# Patient Record
Sex: Male | Born: 1955 | Race: Black or African American | Hispanic: No | Marital: Single | Smoking: Former smoker
Health system: Southern US, Community
[De-identification: ages and names within clinical notes are randomized; demographics above are authoritative.]

## PROBLEM LIST (undated history)

## (undated) DIAGNOSIS — C61 Malignant neoplasm of prostate: Secondary | ICD-10-CM

## (undated) DIAGNOSIS — I493 Ventricular premature depolarization: Secondary | ICD-10-CM

## (undated) DIAGNOSIS — I5022 Chronic systolic (congestive) heart failure: Secondary | ICD-10-CM

## (undated) DIAGNOSIS — G4730 Sleep apnea, unspecified: Secondary | ICD-10-CM

## (undated) DIAGNOSIS — I517 Cardiomegaly: Secondary | ICD-10-CM

## (undated) DIAGNOSIS — I428 Other cardiomyopathies: Secondary | ICD-10-CM

## (undated) DIAGNOSIS — I2510 Atherosclerotic heart disease of native coronary artery without angina pectoris: Secondary | ICD-10-CM

## (undated) DIAGNOSIS — I639 Cerebral infarction, unspecified: Secondary | ICD-10-CM

## (undated) DIAGNOSIS — I10 Essential (primary) hypertension: Secondary | ICD-10-CM

## (undated) DIAGNOSIS — I472 Ventricular tachycardia: Secondary | ICD-10-CM

## (undated) HISTORY — DX: Sleep apnea, unspecified: G47.30

## (undated) HISTORY — DX: Malignant neoplasm of prostate: C61

## (undated) HISTORY — PX: OTHER SURGICAL HISTORY: SHX169

## (undated) HISTORY — DX: Essential (primary) hypertension: I10

## (undated) HISTORY — DX: Cerebral infarction, unspecified: I63.9

## (undated) HISTORY — DX: Cardiomegaly: I51.7

---

## 2003-08-07 ENCOUNTER — Other Ambulatory Visit: Payer: Self-pay

## 2003-10-24 ENCOUNTER — Other Ambulatory Visit: Payer: Self-pay

## 2005-10-27 ENCOUNTER — Emergency Department: Payer: Self-pay | Admitting: Emergency Medicine

## 2005-12-02 ENCOUNTER — Emergency Department: Payer: Self-pay | Admitting: Emergency Medicine

## 2005-12-02 ENCOUNTER — Other Ambulatory Visit: Payer: Self-pay

## 2005-12-06 ENCOUNTER — Ambulatory Visit: Payer: Self-pay | Admitting: Internal Medicine

## 2006-04-29 ENCOUNTER — Emergency Department: Payer: Self-pay | Admitting: Emergency Medicine

## 2006-05-04 ENCOUNTER — Inpatient Hospital Stay: Payer: Self-pay | Admitting: Internal Medicine

## 2006-05-04 ENCOUNTER — Other Ambulatory Visit: Payer: Self-pay

## 2006-12-21 ENCOUNTER — Inpatient Hospital Stay: Payer: Self-pay | Admitting: Internal Medicine

## 2006-12-21 ENCOUNTER — Other Ambulatory Visit: Payer: Self-pay

## 2007-05-15 ENCOUNTER — Ambulatory Visit: Payer: Self-pay | Admitting: Urology

## 2007-05-30 ENCOUNTER — Ambulatory Visit: Payer: Self-pay | Admitting: Radiation Oncology

## 2007-06-03 ENCOUNTER — Ambulatory Visit: Payer: Self-pay | Admitting: Radiation Oncology

## 2007-06-29 ENCOUNTER — Ambulatory Visit: Payer: Self-pay | Admitting: Radiation Oncology

## 2007-07-30 ENCOUNTER — Ambulatory Visit: Payer: Self-pay | Admitting: Radiation Oncology

## 2007-08-30 ENCOUNTER — Ambulatory Visit: Payer: Self-pay | Admitting: Radiation Oncology

## 2007-09-09 ENCOUNTER — Other Ambulatory Visit: Payer: Self-pay

## 2007-09-09 ENCOUNTER — Emergency Department: Payer: Self-pay | Admitting: Emergency Medicine

## 2007-09-27 ENCOUNTER — Ambulatory Visit: Payer: Self-pay | Admitting: Radiation Oncology

## 2007-09-30 ENCOUNTER — Ambulatory Visit: Payer: Self-pay | Admitting: Radiation Oncology

## 2007-10-28 ENCOUNTER — Ambulatory Visit: Payer: Self-pay | Admitting: Radiation Oncology

## 2007-12-28 ENCOUNTER — Ambulatory Visit: Payer: Self-pay | Admitting: Radiation Oncology

## 2008-01-06 ENCOUNTER — Ambulatory Visit: Payer: Self-pay | Admitting: Radiation Oncology

## 2008-01-21 ENCOUNTER — Other Ambulatory Visit: Payer: Self-pay

## 2008-01-21 ENCOUNTER — Emergency Department: Payer: Self-pay | Admitting: Emergency Medicine

## 2008-01-27 ENCOUNTER — Ambulatory Visit: Payer: Self-pay | Admitting: Radiation Oncology

## 2008-06-28 ENCOUNTER — Ambulatory Visit: Payer: Self-pay | Admitting: Radiation Oncology

## 2008-07-08 ENCOUNTER — Ambulatory Visit: Payer: Self-pay | Admitting: Radiation Oncology

## 2008-07-29 ENCOUNTER — Ambulatory Visit: Payer: Self-pay | Admitting: Radiation Oncology

## 2008-09-17 ENCOUNTER — Emergency Department: Payer: Self-pay | Admitting: Emergency Medicine

## 2009-06-28 ENCOUNTER — Ambulatory Visit: Payer: Self-pay | Admitting: Radiation Oncology

## 2009-07-17 ENCOUNTER — Ambulatory Visit: Payer: Self-pay | Admitting: Radiation Oncology

## 2009-07-29 ENCOUNTER — Ambulatory Visit: Payer: Self-pay | Admitting: Radiation Oncology

## 2010-08-27 ENCOUNTER — Ambulatory Visit: Payer: Self-pay | Admitting: Radiation Oncology

## 2010-08-29 ENCOUNTER — Ambulatory Visit: Payer: Self-pay | Admitting: Radiation Oncology

## 2010-10-08 ENCOUNTER — Emergency Department: Payer: Self-pay | Admitting: Emergency Medicine

## 2014-05-13 ENCOUNTER — Ambulatory Visit: Payer: Self-pay

## 2014-08-03 ENCOUNTER — Ambulatory Visit: Payer: Self-pay | Admitting: Radiation Oncology

## 2014-08-10 ENCOUNTER — Ambulatory Visit: Payer: Self-pay | Admitting: Radiation Oncology

## 2014-08-17 ENCOUNTER — Ambulatory Visit: Payer: Self-pay | Admitting: Radiation Oncology

## 2014-08-29 ENCOUNTER — Ambulatory Visit: Payer: Self-pay | Admitting: Radiation Oncology

## 2014-10-12 ENCOUNTER — Ambulatory Visit
Admit: 2014-10-12 | Disposition: A | Discharge status: Home / Self Care | Payer: Self-pay | Attending: Radiation Oncology | Admitting: Radiation Oncology

## 2014-10-12 HISTORY — PX: OTHER SURGICAL HISTORY: SHX169

## 2014-10-28 ENCOUNTER — Ambulatory Visit
Admit: 2014-10-28 | Disposition: A | Discharge status: Home / Self Care | Payer: Self-pay | Attending: Radiation Oncology | Admitting: Radiation Oncology

## 2014-11-02 ENCOUNTER — Ambulatory Visit
Admit: 2014-11-02 | Disposition: A | Discharge status: Home / Self Care | Payer: Self-pay | Attending: Radiation Oncology | Admitting: Radiation Oncology

## 2014-11-02 LAB — HEMOGLOBIN: HGB: 15.1 g/dL (ref 13.0–18.0)

## 2014-11-02 LAB — POTASSIUM: Potassium: 3.8 mmol/L

## 2014-11-09 ENCOUNTER — Ambulatory Visit
Admit: 2014-11-09 | Disposition: A | Discharge status: Home / Self Care | Payer: Self-pay | Attending: Radiation Oncology | Admitting: Radiation Oncology

## 2014-11-27 NOTE — Consult Note (Signed)
Reason for Visit: This 59 year old Male patient presents to the clinic for initial evaluation of  recurrent prostate cancer .   Referred by Dr. Erlene Quan.  Diagnosis:  Chief Complaint/Diagnosis   59 year old male treated back in 2009 with IM RT image guided radiation therapy for Gleason 7 (3+4) adenocarcinoma now with biochemical progression of disease by PSA elevation  Pathology Report pathology note reviewed   Imaging Report bone scan and CT scan reviewed   Referral Report clinical notes reviewed   Planned Treatment Regimen possible salvage interstitial implant   HPI   patient is a 59 year old male well known to our department having been treated back in 2009 for Gleason 7 (3+4) adenocarcinoma treated with image guided IM RT radiation therapy. He has been having rising PSA is the last of which was 5.7 in September 2015. He was seen by urology underwent transrectal ultrasound-guided biopsy with 12 cores all negative for malignancy. Bone scan showed 2 new areas in the lumbar spine compared on CT scan thought to be of significant degenerative change not thought to be likely metastatic foci. CT scan of abdomen and pelvis showed no adenopathy or sclerotic metastatic disease. Patient is having very little voiding symptoms. Specifically denies diarrhea dysuria or any other GI/GU complaints he is having no back pain. He is seen today for consideration of salvage treatment.  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   Respiratory sleep apnea   Neurological/Psychiatric CVA   Family History positive   Family History Comments family history of heart disease and CVA   Social History positive   Social History Comments former 30-pack-year smoking history is quit smoking social EtOH use history   Additional Past Medical and Surgical History seen by himself today   Allergies:   No Known  Allergies:   Home Meds:  Home Medications: Medication Instructions Status  aspirin 325 mg oral enteric coated tablet 1  orally once a day  Active  Coreg 25 mg oral tablet 1  orally 2 times a day  Active  spironolactone 25 mg oral tablet 1  orally once a day  Active  digoxin 125 mcg (0.125 mg) oral tablet 1  orally every other day  Active  enalapril   2 times a day  Active  NIFEdipine 60 mg oral tablet, extended release 1 tab(s) orally once a day Active   Review of Systems:  General negative   Performance Status (ECOG) 0   Skin negative   Breast negative   Ophthalmologic negative   ENMT negative   Respiratory and Thorax negative   Cardiovascular negative   Gastrointestinal negative   Genitourinary negative   Musculoskeletal negative   Neurological negative   Psychiatric negative   Hematology/Lymphatics negative   Endocrine negative   Allergic/Immunologic negative   Review of Systems   denies any weight loss, fatigue, weakness, fever, chills or night sweats. Patient denies any loss of vision, blurred vision. Patient denies any ringing  of the ears or hearing loss. No irregular heartbeat. Patient denies heart murmur or history of fainting. Patient denies any chest pain or pain radiating to her upper extremities. Patient denies any shortness of breath, difficulty breathing at night, cough or hemoptysis. Patient denies any swelling in the lower legs. Patient denies any nausea vomiting, vomiting of blood, or coffee ground material in the vomitus. Patient denies any stomach pain. Patient states has had normal bowel movements no significant constipation or diarrhea.  Patient denies any dysuria, hematuria or significant nocturia. Patient denies any problems walking, swelling in the joints or loss of balance. Patient denies any skin changes, loss of hair or loss of weight. Patient denies any excessive worrying or anxiety or significant depression. Patient denies any problems with  insomnia. Patient denies excessive thirst, polyuria, polydipsia. Patient denies any swollen glands, patient denies easy bruising or easy bleeding. Patient denies any recent infections, allergies or URI. Patient "s visual fields have not changed significantly in recent time.   Nursing Notes:  Nursing Vital Signs and Chemo Nursing Nursing Notes: *CC Vital Signs Flowsheet:   13-Jan-16 13:08  Temp Temperature 96.7  Pulse Pulse 83  Respirations Respirations 18  SBP SBP 172  DBP DBP 107  Pain Scale (0-10)  0  Current Weight (kg) (kg) 101.3   Physical Exam:  General/Skin/HEENT:  General normal   Skin normal   Eyes normal   ENMT normal   Head and Neck normal   Additional PE on rectal exam rectal sphincter tone is good prostate is markedly contracted without evidence of nodularity or mass. Sulcus is hard to discern. No other rectal abdomen is identified. Lungs are clear to A&P cardiac examination shows regular rate and rhythm.   Breasts/Resp/CV/GI/GU:  Respiratory and Thorax normal   Cardiovascular normal   Gastrointestinal normal   Genitourinary normal   MS/Neuro/Psych/Lymph:  Musculoskeletal normal   Neurological normal   Lymphatics normal   Other Results:  Radiology Results: CT:    16-Oct-15 12:20, CT Abdomen and Pelvis W/WO Contrast  CT Abdomen and Pelvis W/WO Contrast   REASON FOR EXAM:    prostate cancer  COMMENTS:       PROCEDURE: KCT - KCT ABDOMEN/PELVIS W/WO  - May 13 2014 12:20PM     CLINICAL DATA:  Prostate cancer.  Prior radiation therapy.    EXAM:  CT ABDOMEN AND PELVIS WITHOUT AND WITH CONTRAST    TECHNIQUE:  Multidetector CT imaging of the abdomen and pelvis was performed  following the standard protocol before and following the bolus  administration of intravenous contrast.  CONTRAST:  125 cc Isovue 370    COMPARISON:  Radiation therapy planning CT scan from 06/11/2007    FINDINGS:  Lower chest: Coronary atherosclerotic calcification  involving the  left main, right, and circumflex coronary artery. Mild cardiomegaly    Hepatobiliary: Portal venous phase enhancing oval shaped 1.8 by 1.3  cm mass inferiorly in segment 3, image 24 of series 18. Similar  diffusely enhancing mass further posteriorly in segment 3 measuring  2.2 by 1.7 cm, image 22 series 18. This latter lesion may have  slightly delayed enhancement on the delayed phase images. Neither of  thumb are very conspicuous on the noncontrast images. Hypodense 5 mm  lesion posteriorly in the left hepatic lobe, image 20 series 18,  remains low-density on delayed phase images, probably a cyst  although technically nonspecific due to very small size.    Pancreas: Unremarkable    Spleen: Unremarkable    Adrenals/Urinary Tract: Unremarkable    Stomach/Bowel: Scant colonic diverticula.    Vascular/Lymphatic: Aortoiliac atherosclerotic vascular disease.  Mildly ectatic infrarenal abdominal aorta. No pathologic adenopathy  observed.  Reproductive: Mildly prominent median lobe of the prostate indents  the bladder base. The prostate gland is not particularly enlarged  otherwise. There is some scattered densities along the prostate  tissue, some calcified, some potentially representing fiducial  markers. Symmetric seminal vesicles.    Other: No supplemental non-categorized findings.  Musculoskeletal: No sclerotic metastatic disease is visible in the  visualized skeleton. Lower lumbar facet arthropathy particularly on  the left at L4-5. Several tiny bone islands in the pelvis are not  change from 2008. Chronic disc osteophyte at L5-S1 with resulting  mild left foraminal stenosis and mild central narrowing of the  thecal sac   IMPRESSION:  1. No adenopathy or sclerotic metastatic disease identified.  2. There are 2 oval-shaped enhancing sharply defined masses  inferiorly in the lateral segment left hepatic lobe, the larger  measuring 2.2 by 1.7 cm, which faintly  enhance on delayed phase  images. These may be hemangiomas but technically nonspecific. These  would be an unusual manifestation for prostate metastatic disease.  Consider hepatic protocol MRI with and without contrast,  specifically using Eovist contrast medium, for further  characterization.  3. The prostate has a mildly prominent median lobe which indents the  bladder base, but otherwise appears unremarkable by CT.      Electronically Signed    By: Sherryl Barters M.D.    On: 05/13/2014 15:25         Verified By: Carron Curie, M.D.,  Nuclear Med:    16-Oct-15 13:32, Bone Scan Whole Body (Part 2 of 2)  Bone Scan Whole Body (Part 2 of 2)   REASON FOR EXAM:    prostate cancer  COMMENTS:       PROCEDURE: KNM - KNM BONE WB 3HR 2 OF 2  - May 13 2014  1:32PM     CLINICAL DATA:  Personal history of prostate cancer.    EXAM:  NUCLEAR MEDICINE WHOLE BODY BONE SCAN    TECHNIQUE:  Whole body anterior and posterior images were obtained approximately  3 hours after intravenous injection of radiopharmaceutical.    RADIOPHARMACEUTICALS:  23.22 mCi Technetium-99 MDP  COMPARISON:  Bone scan of May 15, 2007.  CT scan of today.    FINDINGS:  There is again noted abnormal uptake involving the left frontal,  ethmoid and maxillary sinuses which is unchanged compared to prior  exam and therefore most likely related to chronic sinusitis. Focus  of abnormal uptake is noted in the right midfoot most consistent  with degenerative change.    Two new foci of abnormal uptake are noted in the lumbar spine, 1 on  the right at the L2-3 level, and the other on the left at L4-5. Both  of these are posterior in position, and correspond to areas of  significant facet degenerative change seen on this CT scan of the  same day. No other areas of abnormal uptake are noted. The areas of  abnormal uptake in the left anterior ribs on prior exam are no  longer present     IMPRESSION:  Stable  abnormal uptake is seen involving the left frontal, ethmoid  and maxillary sinus region consistent with chronic sinusitis.    New focus of abnormal uptake is noted in the right midfoot  consistent with degenerative joint disease.    Two new foci of abnormal uptake arenoted in the posterior portion  of the lumbar spine, 1 at the L2-3 level, and the other at the L4-5  level. These correspond to areas of significant degenerative change  involving the posterior facet joints at these levels based on the CT  scan performed today. Therefore, it is felt unlikely that these  represent metastatic disease.  No definite evidence of osseous metastases is seen on this exam.      Electronically Signed  By: Sabino Dick M.D.    On: 05/13/2014 15:28         Verified By: Marveen Reeks, M.D.,   Relevent Results:   Relevant Scans and Labs bone scan and CT scans reviewed   Assessment and Plan: Impression:   biochemical recurrence of prostate cancer in 59 year old male status post image guided I'm RT radiation therapy back in 2009. Plan:   at this time to better discern the new lesions in his lumbar spine and I have ordered an MRI scan with contrast to rule out possible metastatic disease. Should that be normal I have recommended salvage brachii therapy. I would plan on delivering 100 gray to his prostatic volume. Would perform volume study followed by I-125 interstitial implant. I have set up a follow-up appointment with the patient after completion of his MRI scan to discuss those results and start scheduling for volume study. Other means of salvage including cryotherapy and radical sury were discussed. Risks and benefits of radiation seed implant including increased urinary lower tract symptoms as well as possible diarrhea, and the complications of outpatient surgery all were explained in detail to the patient. He seems to comprehend my treatment plan well.  I would like to take this opportunity for  allowing me to participate in the care of your patient..  Fax to Physician:  Physicians To Recieve Fax: Sherlynn Stalls, MD - [phone] Lavera Guise, MD - [phone].  Electronic Signatures: Armstead Peaks (MD)  (Signed 13-Jan-16 13:59)  Authored: HPI, Diagnosis, PFSH, Allergies, Home Meds, ROS, Nursing Notes, Physical Exam, Other Results, Relevent Results, Encounter Assessment and Plan, Fax to Physician   Last Updated: 13-Jan-16 13:59 by Armstead Peaks (MD)

## 2014-11-27 NOTE — Op Note (Signed)
PATIENT NAME:  Gary [name], Gary [name] MR#:  [id number] DATE OF BIRTH:  10-18-1955  DATE OF PROCEDURE:  11/09/2014  PREOPERATIVE DIAGNOSIS: Prostate cancer.   POSTOPERATIVE DIAGNOSIS: Prostate cancer.   PROCEDURE PERFORMED: Cystoscopy, implantation of prostatic brachytherapy seeds.   ATTENDING SURGEON: Sherlynn Stalls, M.D.   RADIATION ONCOLOGIST: Armstead Peaks, M.D.    ANESTHESIA: General.   ESTIMATED BLOOD LOSS: Minimal.   DRAINS: None.   COMPLICATIONS: None.   SPECIMENS: None.   INDICATION: This is a 59 year old male with a history of prostate cancer and localized biochemical recurrence.  He previously received radiation therapy and presents today for salvage brachytherapy as per Dr. Baruch Gouty. Risks and benefits of the procedure were explained in detail. The patient agreed to proceed as planned.   PROCEDURE: The patient was correctly identified in the preoperative holding area and informed consent was confirmed. He was brought to the operating suite and placed on the table in supine position. At this time, universal timeout protocol was performed. All team members were identified. Venodyne boots were placed and he was administered IV Ancef in the perioperative period. He was then placed under general anesthesia, repositioned lower on the bed in the dorsal lithotomy position, and prepped and draped in standard surgical fashion. A sticky drape was used to elevate the scrotum to allow for better visualization and access to the perineum.  A Foley catheter was placed sterilely on the field.  At this point in time, the transrectal ultrasound and brachytherapy apparatus and grid were assembled and the grid was placed adjacent to the patient's perineum. This was lined up with the previous volume study images to help facilitate proper placement of the seeds. At this point in time, we began sequentially placing the brachytherapy seeds.  A total of 10 needles were used and 36 seeds to complete the  procedure.  Care was taken to ensure that all brachytherapy seeds were placed within the capsule of the prostate and not beyond into the bladder. At this point in time, the transrectal ultrasound was removed as well as the grid and a formal cystoscopy was performed using a 31 French rigid cystoscope. This revealed a somewhat tight, but small prostate with significant coapting lateral lobes, right greater than left. The bladder was visualized and was noted to be free of any tumors or lesions.  There were no brachytherapy seeds or bleeding noted within the bladder itself. The bladder was then drained and the scope was removed.  A single x-ray film was taken and it appeared that there was excellent coverage of the brachytherapy seeds within the prostate. These were counted and noted to be correct in correlation with the number os seeds that had been placed. He was then repositioned in the supine position, reversed from anesthesia, and taken to the PACU in stable condition. There were no complications in this case.    ____________________________ Sherlynn Stalls, MD ajb:sp D: 11/09/2014 12:16:00 ET T: 11/09/2014 12:43:15 ET JOB#: [id number]  cc: Sherlynn Stalls, MD, <Dictator> Sherlynn Stalls MD ELECTRONICALLY SIGNED 11/24/2014 16:23

## 2014-11-27 NOTE — Op Note (Signed)
PATIENT NAME:  [name], Gary MR#:  [id number] DATE OF BIRTH:  April 09, 1956  DATE OF PROCEDURE:  10/12/2014  PREOPERATIVE DIAGNOSIS: History of prostate cancer, status post XRT, biochemical recurrence.   POSTOPERATIVE DIAGNOSIS:   History of prostate cancer status post XRT, biochemical recurrence.   PROCEDURE: Transrectal ultrasound volume study.   ANESTHESIA:  None.    SURGEON: Sherlynn Stalls, MD  RADIATION ONCOLOGIST:  Dr. Armstead Peaks MD   EBL:  None.   COMPLICATIONS: None.   DRAINS: None.   INDICATION: This is an [place] American male who underwent IMRT for treatment of his prostate cancer.  Unfortunately, he failed to follow up and did not have his PSA's  monitored. He presented with an elevated PSA concerning for biochemical recurrence.   Metastatic work up was negative. He will be undergoing salvage brachytherapy with Dr. Baruch Gouty and presents today for a volume study for this purpose.   PROCEDURE: The patient was correctly identified and brought to the operating room.   In the operating suite, he was placed on the table in the supine position.   A timeout protocol was performed to confirm the patient's identity and date of birth.  He was then placed in the dorsal lithotomy position. He was prepped sterilely and a 16 French Foley catheter was placed without difficulty.   A transrectal ultrasound probe was then placed on the staging apparatus  in order to visualize  the prostate. The Foley catheter balloon could be seen in the bladder. At this point in time, stage-wise progression using transverse cuts 5 mm in size were obtained starting at the base of the prostate down to the apex.   The prostate itself was encircled and the sizes  were measured. All of this was saved in order to plan his brachytherapy seed placement. Once the procedure was complete, a transrectal ultrasound probe was removed, the Foley catheter was removed. He was reversed in the supine position and taken back to  the [facility].     ____________________________ Sherlynn Stalls, MD ajb:tr D: 10/12/2014 12:01:48 ET T: 10/12/2014 12:42:20 ET JOB#: [id number]  cc: Sherlynn Stalls, MD, <Dictator> Sherlynn Stalls MD ELECTRONICALLY SIGNED 11/08/2014 18:07

## 2014-11-27 NOTE — Consult Note (Signed)
Reason for Visit: This 59 year old Male patient presents to the clinic for preop history and physical .   Referred by Dr. Erlene Quan.  Diagnosis:  Chief Complaint/Diagnosis   51-year-old male with recurrent adenocarcinoma of the prostate status post IM RT treatment in 2009 now for salvage I-125 interstitial implant  HPI   patient is a 59 year old male having been treated back in 2009 for Gleason 7 (3+4) adenocarcinoma treated with image guided I Marti. He was having rising PSA up to 5.7 back in September 2015 underwent transrectal ultrasound-guided biopsy with 12 cores negative for malignancy.he did have an area on bone scan in his lumbar spine which on MRI scan wasfacet degeneration at L2-3 no evidence of malignancy. The plan was to proceed with salvage I-125 interstitial implant to deliver 100 grade was prostatic volume. He is seen today for preop history and physical and volume study. He is doing fairly well specifically denies any lower urinary tract symptoms at this time.  Past Hx:    Prostate Cancer:    Enlarged Heart:    HTN:    tumor removed from colon:   Past, Family and Social History:  Past Medical History positive   Cardiovascular hyperlipidemia; hypertension; cardiomyopathy   Respiratory sleep apnea   Cancer prostate cancer as described above   Family History positive   Family History Comments family history positive for cardiac vascular heart disease   Social History positive   Social History Comments former 30 pack year smoking history has quit smoking social EtOH use history   Additional Past Medical and Surgical History seen by himself today   Allergies:   No Known Allergies:   Home Meds:  Home Medications: Medication Instructions Status  aspirin 325 mg oral enteric coated tablet 1  orally once a day  Active  Coreg 25 mg oral tablet 1  orally 2 times a day  Active  spironolactone 25 mg oral tablet 1  orally once a day  Active  digoxin 125 mcg (0.125  mg) oral tablet 1  orally every other day  Active  enalapril   2 times a day  Active  NIFEdipine 60 mg oral tablet, extended release 1 tab(s) orally once a day Active   Review of Systems:  General negative   Performance Status (ECOG) 0   Skin negative   Breast negative   Ophthalmologic negative   ENMT negative   Respiratory and Thorax negative   Cardiovascular negative   Gastrointestinal negative   Genitourinary see HPI   Musculoskeletal negative   Neurological negative   Psychiatric negative   Hematology/Lymphatics negative   Endocrine negative   Allergic/Immunologic negative   Nursing Notes:  Nursing Vital Signs and Chemo Nursing Nursing Notes: *CC Vital Signs Flowsheet:   16-Mar-16 10:47  Temp Temperature 96.2  Pulse Pulse 79  Respirations Respirations 18  SBP SBP 168  DBP DBP 110  Pain Scale (0-10)  0  Current Weight (kg) (kg) 102.1   Physical Exam:  General/Skin/HEENT:  General normal   Skin normal   Eyes normal   ENMT normal   Head and Neck normal   Additional PE well-developed male in NAD lungs are clear to A&P cardiac examination shows regular rate and rhythm. Abdomen is benign.   Breasts/Resp/CV/GI/GU:  Respiratory and Thorax normal   Cardiovascular normal   Gastrointestinal normal   Genitourinary normal   MS/Neuro/Psych/Lymph:  Musculoskeletal normal   Neurological normal   Lymphatics normal   Assessment and Plan: Impression:   recurrent  adenocarcinoma prostate status post IM RT radiation therapy back in 2009 for I-125 interstitial implant and volume study today. Plan:   the stomach on over risks and benefits of I-125 interstitial implant and radiation safety precautions. Patient will be taken to the OR today for volume study along with urology. Consent form was signed. We will plan on delivering 100 gray to his prostate volume. Scheduling for implant was determined. Patient is to call with any concerns.  I would like  to take this opportunity for allowing me to participate in the care of your patient..  Electronic Signatures: Armstead Peaks (MD)  (Signed 16-Mar-16 12:21)  Authored: HPI, Diagnosis, Past Hx, PFSH, Allergies, Home Meds, ROS, Nursing Notes, Physical Exam, Encounter Assessment and Plan   Last Updated: 16-Mar-16 12:21 by Armstead Peaks (MD)

## 2014-11-28 ENCOUNTER — Ambulatory Visit: Payer: Medicare Other | Admitting: Radiation Oncology

## 2014-12-07 ENCOUNTER — Ambulatory Visit
Admission: RE | Admit: 2014-12-07 | Discharge: 2014-12-07 | Disposition: A | Discharge status: Home / Self Care | Payer: Medicare Other | Source: Ambulatory Visit | Attending: Radiation Oncology | Admitting: Radiation Oncology

## 2014-12-07 ENCOUNTER — Encounter: Payer: Self-pay | Admitting: Radiation Oncology

## 2014-12-07 VITALS — BP 165/100 | HR 82 | Temp 98.0°F | Resp 20 | Wt 225.9 lb

## 2014-12-07 DIAGNOSIS — C61 Malignant neoplasm of prostate: Secondary | ICD-10-CM

## 2014-12-07 DIAGNOSIS — Z510 Encounter for antineoplastic radiation therapy: Secondary | ICD-10-CM | POA: Diagnosis present

## 2014-12-07 NOTE — Progress Notes (Signed)
Radiation Oncology Follow up Note  Name: Gary [name]   Date:   12/07/2014 MRN:  [id number] DOB: [birth year]    This 59 y.o. male presents to the clinic today for follow-up prostate cancer.  REFERRING PROVIDER: Dr. Erlene Quan HPI: Patient is a 59 year old male now out 1 month having completed salvage I-125 interstitial implant status post IM RT radiation therapy in 2009 for Gleason 7 (3+4) adenocarcinoma. He started having rising PSA in September 2015. Bone scan showed suspicious area and lumbar spine not confirmed on MRI scan. We took him to the operating room and performed I-125 interstitial implant. He is now seen 1 month out and is doing well specifically denies diarrhea dysuria or any other GI/GU complaints..  COMPLICATIONS OF TREATMENT: none  FOLLOW UP COMPLIANCE: keeps appointments   PHYSICAL EXAM:  BP 165/100 mmHg  Pulse 82  Temp(Src) 98 F (36.7 C)  Resp 20  Wt 225 lb 13.8 oz (102.45 kg) On rectal exam rectal sphincter tone is good. Prostate is smooth contracted without evidence of nodularity or mass. Sulcus is preserved bilaterally. No discrete nodularity is identified. No other rectal abnormalities are noted. Well-developed well-nourished patient in NAD. HEENT reveals PERLA, EOMI, discs not visualized.  Oral cavity is clear. No oral mucosal lesions are identified. Neck is clear without evidence of cervical or supraclavicular adenopathy. Lungs are clear to A&P. Cardiac examination is essentially unremarkable with regular rate and rhythm without murmur rub or thrill. Abdomen is benign with no organomegaly or masses noted. Motor sensory and DTR levels are equal and symmetric in the upper and lower extremities. Cranial nerves II through XII are grossly intact. Proprioception is intact. No peripheral adenopathy or edema is identified. No motor or sensory levels are noted. Crude visual fields are within normal range.   RADIOLOGY RESULTS: CT scan for quality assurance was performed  showing excellent seed placement. Follow-up quality assurance with BrachyVision will be performed  PLAN: Present time he is doing well 1 month out of salvage seed implantation for biochemical recurrent prostate cancer. He is clinically doing well without side effect or complaint. Analysis will mean of seed implantation although on visual inspection a look excellent. I've explained to my PSA protocol would like to see him back in 4 months at which time if PSA has not been performed we will do it. Otherwise patient knows to call with any concerns.  I would like to take this opportunity for allowing me to participate in the care of your patient.Armstead Peaks., MD

## 2014-12-14 DIAGNOSIS — Z510 Encounter for antineoplastic radiation therapy: Secondary | ICD-10-CM | POA: Diagnosis not present

## 2015-02-21 ENCOUNTER — Encounter: Payer: Self-pay | Admitting: Unknown Physician Specialty

## 2015-04-06 ENCOUNTER — Other Ambulatory Visit: Payer: Self-pay | Admitting: *Deleted

## 2015-04-12 ENCOUNTER — Inpatient Hospital Stay: Payer: Medicare Other | Attending: Radiation Oncology

## 2015-04-12 ENCOUNTER — Other Ambulatory Visit: Payer: Self-pay | Admitting: *Deleted

## 2015-04-12 ENCOUNTER — Ambulatory Visit: Payer: Medicare Other | Admitting: Radiation Oncology

## 2015-05-04 ENCOUNTER — Encounter: Payer: Self-pay | Admitting: Radiation Oncology

## 2015-05-04 ENCOUNTER — Ambulatory Visit
Admission: RE | Admit: 2015-05-04 | Discharge: 2015-05-04 | Disposition: A | Discharge status: Home / Self Care | Payer: Medicare Other | Source: Ambulatory Visit | Attending: Radiation Oncology | Admitting: Radiation Oncology

## 2015-05-04 ENCOUNTER — Other Ambulatory Visit
Admission: RE | Admit: 2015-05-04 | Discharge: 2015-05-04 | Disposition: A | Discharge status: Home / Self Care | Payer: Medicare Other | Source: Other Acute Inpatient Hospital | Attending: Physician Assistant | Admitting: Physician Assistant

## 2015-05-04 ENCOUNTER — Inpatient Hospital Stay: Payer: Medicare Other | Attending: Radiation Oncology

## 2015-05-04 ENCOUNTER — Other Ambulatory Visit: Payer: Self-pay | Admitting: *Deleted

## 2015-05-04 VITALS — BP 168/93 | HR 70 | Temp 96.6°F | Resp 18 | Wt 227.7 lb

## 2015-05-04 DIAGNOSIS — I10 Essential (primary) hypertension: Secondary | ICD-10-CM | POA: Diagnosis present

## 2015-05-04 DIAGNOSIS — Z0001 Encounter for general adult medical examination with abnormal findings: Secondary | ICD-10-CM | POA: Insufficient documentation

## 2015-05-04 DIAGNOSIS — C61 Malignant neoplasm of prostate: Secondary | ICD-10-CM | POA: Insufficient documentation

## 2015-05-04 DIAGNOSIS — E782 Mixed hyperlipidemia: Secondary | ICD-10-CM | POA: Insufficient documentation

## 2015-05-04 LAB — CBC WITH DIFFERENTIAL/PLATELET
BASOS ABS: 0.1 10*3/uL (ref 0–0.1)
BASOS PCT: 1 %
Eosinophils Absolute: 0.4 10*3/uL (ref 0–0.7)
Eosinophils Relative: 6 %
HEMATOCRIT: 46.9 % (ref 40.0–52.0)
HEMOGLOBIN: 15 g/dL (ref 13.0–18.0)
Lymphocytes Relative: 34 %
Lymphs Abs: 2.5 10*3/uL (ref 1.0–3.6)
MCH: 26.2 pg (ref 26.0–34.0)
MCHC: 32.1 g/dL (ref 32.0–36.0)
MCV: 81.8 fL (ref 80.0–100.0)
Monocytes Absolute: 0.7 10*3/uL (ref 0.2–1.0)
Monocytes Relative: 10 %
NEUTROS ABS: 3.6 10*3/uL (ref 1.4–6.5)
NEUTROS PCT: 49 %
Platelets: 190 10*3/uL (ref 150–440)
RBC: 5.73 MIL/uL (ref 4.40–5.90)
RDW: 15.8 % — ABNORMAL HIGH (ref 11.5–14.5)
WBC: 7.3 10*3/uL (ref 3.8–10.6)

## 2015-05-04 LAB — COMPREHENSIVE METABOLIC PANEL
ALBUMIN: 3.8 g/dL (ref 3.5–5.0)
ALK PHOS: 55 U/L (ref 38–126)
ALT: 15 U/L — ABNORMAL LOW (ref 17–63)
ANION GAP: 8 (ref 5–15)
AST: 19 U/L (ref 15–41)
BUN: 18 mg/dL (ref 6–20)
CHLORIDE: 107 mmol/L (ref 101–111)
CO2: 25 mmol/L (ref 22–32)
Calcium: 9.4 mg/dL (ref 8.9–10.3)
Creatinine, Ser: 1.42 mg/dL — ABNORMAL HIGH (ref 0.61–1.24)
GFR calc Af Amer: >60 mL/min (ref >60)
GFR calc non Af Amer: 53 mL/min — ABNORMAL LOW (ref >60)
GLUCOSE: 99 mg/dL (ref 65–99)
POTASSIUM: 4.4 mmol/L (ref 3.5–5.1)
SODIUM: 140 mmol/L (ref 135–145)
Total Bilirubin: 0.6 mg/dL (ref 0.3–1.2)
Total Protein: 6.3 g/dL — ABNORMAL LOW (ref 6.5–8.1)

## 2015-05-04 LAB — LIPID PANEL
CHOL/HDL RATIO: 4.6 ratio
Cholesterol: 144 mg/dL (ref 0–200)
HDL: 31 mg/dL — AB (ref >40)
LDL CALC: 91 mg/dL (ref 0–99)
Triglycerides: 110 mg/dL (ref <150)
VLDL: 22 mg/dL (ref 0–40)

## 2015-05-04 LAB — TSH: TSH: 1.332 u[IU]/mL (ref 0.350–4.500)

## 2015-05-04 LAB — PSA: PSA: 1.6 ng/mL (ref 0.00–4.00)

## 2015-05-04 NOTE — Progress Notes (Signed)
Radiation Oncology Follow up Note  Name: Gary [name]   Date:   05/04/2015 MRN:  [id number] DOB: Mar 19, 1956    This 59 y.o. male presents to the clinic today for follow-up for prostate cancer status post I-125 interstitial implant for salvage secondary to I am RT radiation therapy in 2009 for Gleason 7 (3+4) adenocarcinoma.  REFERRING PROVIDER: Lavera Guise, MD  HPI: Patient is a 59 year old male who is now 5 months out having completed salvage I-125 interstitial implant for prostate cancer. He originally presented in 2009 with a Gleason 7 (3+4) adenocarcinoma and then had a biochemical failure. He is seen today in routine follow-up and is doing well specifically denies any lower urinary tract symptoms or diarrhea. He's had a PSA performed today which we will report separately..  COMPLICATIONS OF TREATMENT: none  FOLLOW UP COMPLIANCE: keeps appointments   PHYSICAL EXAM:  BP 168/93 mmHg  Pulse 70  Temp(Src) 96.6 F (35.9 C)  Resp 18  Wt 227 lb 11.8 oz (103.3 kg) On rectal exam rectal sphincter tone is good. Prostate is smooth contracted without evidence of nodularity or mass. Sulcus is preserved bilaterally. No discrete nodularity is identified. No other rectal abnormalities are noted. Well-developed well-nourished patient in NAD. HEENT reveals PERLA, EOMI, discs not visualized.  Oral cavity is clear. No oral mucosal lesions are identified. Neck is clear without evidence of cervical or supraclavicular adenopathy. Lungs are clear to A&P. Cardiac examination is essentially unremarkable with regular rate and rhythm without murmur rub or thrill. Abdomen is benign with no organomegaly or masses noted. Motor sensory and DTR levels are equal and symmetric in the upper and lower extremities. Cranial nerves II through XII are grossly intact. Proprioception is intact. No peripheral adenopathy or edema is identified. No motor or sensory levels are noted. Crude visual fields are within normal  range.  RADIOLOGY RESULTS: No current films for review  PLAN: At the present time I have run a PSA and will report that separately. He is doing extremely well with very little side effects or complaints. I've asked to see him back in 6 months for follow-up and will continue tracking his PSA. Patient knows to call sooner with any concerns.  I would like to take this opportunity for allowing me to participate in the care of your patient.Armstead Peaks., MD

## 2015-05-05 LAB — T3: T3 TOTAL: 131 ng/dL (ref 71–180)

## 2015-05-05 LAB — T4: T4, Total: 7.7 ug/dL (ref 4.5–12.0)

## 2015-08-16 ENCOUNTER — Telehealth: Payer: Self-pay

## 2015-08-16 NOTE — Telephone Encounter (Signed)
Pt pharmacy sent a refill request on cipro. Spoke with pt who stated he needs more abx. Made pt aware he has not been seen in over a year and needs an office visit. Pt requested office be after feb. Made pt aware he can not receive anymore medication until the appt. Pt voiced understanding.

## 2015-10-03 ENCOUNTER — Ambulatory Visit: Payer: Self-pay | Admitting: Urology

## 2015-10-04 ENCOUNTER — Encounter: Payer: Self-pay | Admitting: Urology

## 2015-10-04 ENCOUNTER — Ambulatory Visit: Payer: Self-pay | Admitting: Urology

## 2015-10-18 ENCOUNTER — Ambulatory Visit: Payer: Self-pay | Admitting: Urology

## 2015-10-26 ENCOUNTER — Other Ambulatory Visit: Payer: Self-pay | Admitting: *Deleted

## 2015-11-01 ENCOUNTER — Other Ambulatory Visit: Payer: Self-pay | Admitting: *Deleted

## 2015-11-02 ENCOUNTER — Ambulatory Visit: Payer: Medicare Other | Attending: Radiation Oncology | Admitting: Radiation Oncology

## 2015-11-02 ENCOUNTER — Inpatient Hospital Stay: Payer: Medicare Other | Attending: Radiation Oncology

## 2015-11-03 ENCOUNTER — Ambulatory Visit (INDEPENDENT_AMBULATORY_CARE_PROVIDER_SITE_OTHER): Payer: Medicare Other | Admitting: Urology

## 2015-11-03 ENCOUNTER — Encounter: Payer: Self-pay | Admitting: Urology

## 2015-11-03 VITALS — BP 137/94 | HR 88 | Temp 97.8°F | Resp 18 | Wt 225.0 lb

## 2015-11-03 DIAGNOSIS — Z8546 Personal history of malignant neoplasm of prostate: Secondary | ICD-10-CM

## 2015-11-03 NOTE — Progress Notes (Signed)
11/03/2015 12:50 PM   Gary [name] Jan 16, 1956 [id number]  Referring provider: Lavera Guise, MD [address] [place], [place] [address]  Chief Complaint  Patient presents with  . Benign Prostatic Hypertrophy    HPI: 60 yo M with history of Gleason 7 (3+4) prostate cancer with biochemical recurrence s/p salvage brachytherapy 10/2014.  Prostate cancer history s/p radiation therapy at [facility] in 08/2007 for Gleason 3+4 prostate cancer. Per available notes appears nadir was less than 1.0. He has seen again at the [facility] for few years post radiation treatment but was lost to f/u. Last available PSA from [place] [facility] 06/2008 2.4. He was under the impression that he was cleared of his cancer so he stopped going.   His PCP subsequently rose to 3.7 on 8/14 and 5.7 on 03/2014.  He returned for prostate biopsy on 06/28/2014 of which all 12 cores were negative for malignancy.  He underwent restaging with bone scan and CT abd/ pelvis which does not show any evidence of metastatic diease.   He was referred back to radiation oncology at which time he underwent salvage brachial therapy on 10/2014.   He was last seen in 10 /2016.   At this time, his PSA was 1.6.    He is overdue for repeat PSA and failed to follow up with radiation oncology.  No bone pain or weight loss.  BPH Enlarged median lobe, small 22 gram prostate. Stopped flomax 1 year ago. No voiding issues today.  Nocturia x 1.  No dysuria or gross hematuria.  No urinary frequency or urgency.     PMH: Past Medical History  Diagnosis Date  . Prostate cancer ([place])   . Hypertension   . Enlarged heart   . Stroke ([place])   . Sleep apnea     Surgical History: Past Surgical History  Procedure Laterality Date  . Tumor removed from colon    . Prostate i-125 seed implant  10/12/14    Home Medications:    Medication List       This list is accurate as of: 11/03/15 12:50 PM.  Always use your most recent med list.                 amLODipine 10 MG tablet  Commonly known as:  NORVASC  Take 10 mg by mouth daily.     aspirin 81 MG tablet  Take 81 mg by mouth daily.     atorvastatin 20 MG tablet  Commonly known as:  LIPITOR  Take 20 mg by mouth daily.     carvedilol 25 MG tablet  Commonly known as:  COREG  Take 25 mg by mouth 2 (two) times daily with a meal.     digoxin 0.125 MG tablet  Commonly known as:  LANOXIN  Take 0.125 mg by mouth every other day.     enalapril 10 MG tablet  Commonly known as:  VASOTEC  Take 10 mg by mouth 2 (two) times daily.     NIFEdipine 60 MG 24 hr tablet  Commonly known as:  PROCARDIA XL/ADALAT-CC  Take 60 mg by mouth daily.     spironolactone 25 MG tablet  Commonly known as:  ALDACTONE  Take 25 mg by mouth daily.        Allergies: No Known Allergies  Family History: Family History  Problem Relation Age of Onset  . Kidney cancer Neg Hx   . Prostate cancer Neg Hx     Social History:  reports that he quit smoking about 18 months ago. His smoking use included Cigarettes. He has never used smokeless tobacco. He reports that he drinks about 0.6 oz of alcohol per week. He reports that he does not use illicit drugs.  ROS: UROLOGY Frequent Urination?: No Hard to postpone urination?: No Burning/pain with urination?: No Get up at night to urinate?: No Leakage of urine?: No Urine stream starts and stops?: No Trouble starting stream?: No Do you have to strain to urinate?: No Blood in urine?: No Urinary tract infection?: No Sexually transmitted disease?: No Injury to kidneys or bladder?: No Painful intercourse?: No Weak stream?: No Erection problems?: No Penile pain?: No  Gastrointestinal Nausea?: No Vomiting?: No Indigestion/heartburn?: No Diarrhea?: No Constipation?: No  Constitutional Fever: No Night sweats?: No Weight loss?: No Fatigue?: No  Skin Skin rash/lesions?: No Itching?: No  Eyes Blurred vision?: No Double vision?:  No  Ears/Nose/Throat Sore throat?: No Sinus problems?: No  Hematologic/Lymphatic Swollen glands?: No Easy bruising?: No  Cardiovascular Leg swelling?: No Chest pain?: No  Respiratory Cough?: No Shortness of breath?: No  Endocrine Excessive thirst?: No  Musculoskeletal Back pain?: No Joint pain?: No  Neurological Headaches?: No Dizziness?: No  Psychologic Depression?: No Anxiety?: No  Physical Exam: BP 137/94 mmHg  Pulse 88  Temp(Src) 97.8 F (36.6 C)  Resp 18  Wt 225 lb (102.059 kg)  Constitutional:  Alert and oriented, No acute distress. HEENT: [place] AT, moist mucus membranes.  Trachea midline, no masses. Cardiovascular: No clubbing, cyanosis, or edema. Respiratory: Normal respiratory effort, no increased work of breathing. Skin: No rashes, bruises or suspicious lesions. Neurologic: Grossly intact, no focal deficits, moving all 4 extremities. Psychiatric: Normal mood and affect.  Laboratory Data: Lab Results  Component Value Date   WBC 7.3 05/04/2015   HGB 15.0 05/04/2015   HCT 46.9 05/04/2015   MCV 81.8 05/04/2015   PLT 190 05/04/2015    Lab Results  Component Value Date   CREATININE 1.42* 05/04/2015    Lab Results  Component Value Date   PSA 1.60 05/04/2015   PSA  08/27/2010    ========== TEST NAME ==========  ========= RESULTS =========  = REFERENCE RANGE =  PROSTATE-SPECIFIC AG.  PSA, Serum (Serial Monitor) Prostate Specific Ag, Serum     [   1.0 ng/mL            ]           0.0-4.0 Roche ECLIA methodology.                                                                      . According to the American Urological Association, Serum PSA should decrease and remain at undetectable levels after radical prostatectomy. The AUA defines biochemical recurrence as an initial PSA value 0.2 ng/mL or greater followed by a subsequent confirmatory PSA value 0.2 ng/mL or greater. Values obtained with different assay methods or kits cannot be  used interchangeably. Results cannot be interpreted as absolute evidence of the presence or absence of malignant disease.               [facility]            No: [id number]  [address] [place], [place] [address]           Lindon Romp, MD         [phone]   Result(s) reported on 28 Aug 2010 at 01:19PM.      Pertinent Imaging: n/a  Assessment & Plan:    1. History of prostate cancer  History of Gleason 3+4 prostate cancer, status post XRT with biochemical failure. More recent salvage brachytherapy 10/2014.  Overdue for PSA , checked today. Did not follow up with Dr. Baruch Gouty as scheduled, help him him reschedule this Stressed importance of routine follow up (history of loss to follow up x 2) No voiding symptoms - PSA   Return in about 6 months (around 05/04/2016) for Dr. Erlene Quan (also please call and help him get Dr.Chrystal f/u, overdue for this).  Hollice Espy, MD  [facility] [address] [place], [place] [address] [phone]

## 2015-11-04 LAB — PSA: Prostate Specific Ag, Serum: 0.9 ng/mL (ref 0.0–4.0)

## 2015-11-13 ENCOUNTER — Ambulatory Visit: Payer: Medicare Other | Admitting: Radiation Oncology

## 2016-04-14 ENCOUNTER — Inpatient Hospital Stay
Admit: 2016-04-14 | Discharge: 2016-04-14 | Disposition: A | Discharge status: Home / Self Care | Payer: Medicare Other | Attending: Internal Medicine | Admitting: Internal Medicine

## 2016-04-14 ENCOUNTER — Emergency Department: Payer: Medicare Other

## 2016-04-14 ENCOUNTER — Encounter: Payer: Self-pay | Admitting: Emergency Medicine

## 2016-04-14 ENCOUNTER — Inpatient Hospital Stay: Payer: Medicare Other

## 2016-04-14 ENCOUNTER — Inpatient Hospital Stay
Admission: EM | Admit: 2016-04-14 | Discharge: 2016-04-19 | DRG: 286 | Disposition: A | Discharge status: Home / Self Care | Payer: Medicare Other | Attending: Internal Medicine | Admitting: Internal Medicine

## 2016-04-14 DIAGNOSIS — I248 Other forms of acute ischemic heart disease: Secondary | ICD-10-CM | POA: Diagnosis present

## 2016-04-14 DIAGNOSIS — N182 Chronic kidney disease, stage 2 (mild): Secondary | ICD-10-CM | POA: Diagnosis present

## 2016-04-14 DIAGNOSIS — Z79899 Other long term (current) drug therapy: Secondary | ICD-10-CM | POA: Diagnosis not present

## 2016-04-14 DIAGNOSIS — I472 Ventricular tachycardia, unspecified: Secondary | ICD-10-CM

## 2016-04-14 DIAGNOSIS — N179 Acute kidney failure, unspecified: Secondary | ICD-10-CM | POA: Diagnosis present

## 2016-04-14 DIAGNOSIS — Z923 Personal history of irradiation: Secondary | ICD-10-CM

## 2016-04-14 DIAGNOSIS — E785 Hyperlipidemia, unspecified: Secondary | ICD-10-CM | POA: Diagnosis not present

## 2016-04-14 DIAGNOSIS — D751 Secondary polycythemia: Secondary | ICD-10-CM | POA: Diagnosis present

## 2016-04-14 DIAGNOSIS — I493 Ventricular premature depolarization: Secondary | ICD-10-CM | POA: Diagnosis present

## 2016-04-14 DIAGNOSIS — I5023 Acute on chronic systolic (congestive) heart failure: Secondary | ICD-10-CM | POA: Diagnosis present

## 2016-04-14 DIAGNOSIS — Z7982 Long term (current) use of aspirin: Secondary | ICD-10-CM

## 2016-04-14 DIAGNOSIS — I5043 Acute on chronic combined systolic (congestive) and diastolic (congestive) heart failure: Secondary | ICD-10-CM | POA: Diagnosis not present

## 2016-04-14 DIAGNOSIS — R0902 Hypoxemia: Secondary | ICD-10-CM | POA: Diagnosis present

## 2016-04-14 DIAGNOSIS — R002 Palpitations: Secondary | ICD-10-CM | POA: Diagnosis present

## 2016-04-14 DIAGNOSIS — I209 Angina pectoris, unspecified: Secondary | ICD-10-CM

## 2016-04-14 DIAGNOSIS — I428 Other cardiomyopathies: Secondary | ICD-10-CM

## 2016-04-14 DIAGNOSIS — R0600 Dyspnea, unspecified: Secondary | ICD-10-CM | POA: Diagnosis not present

## 2016-04-14 DIAGNOSIS — Z8546 Personal history of malignant neoplasm of prostate: Secondary | ICD-10-CM

## 2016-04-14 DIAGNOSIS — Z23 Encounter for immunization: Secondary | ICD-10-CM

## 2016-04-14 DIAGNOSIS — I2510 Atherosclerotic heart disease of native coronary artery without angina pectoris: Secondary | ICD-10-CM | POA: Diagnosis present

## 2016-04-14 DIAGNOSIS — R05 Cough: Secondary | ICD-10-CM | POA: Diagnosis present

## 2016-04-14 DIAGNOSIS — Z8673 Personal history of transient ischemic attack (TIA), and cerebral infarction without residual deficits: Secondary | ICD-10-CM

## 2016-04-14 DIAGNOSIS — G4733 Obstructive sleep apnea (adult) (pediatric): Secondary | ICD-10-CM | POA: Diagnosis present

## 2016-04-14 DIAGNOSIS — I420 Dilated cardiomyopathy: Secondary | ICD-10-CM | POA: Diagnosis present

## 2016-04-14 DIAGNOSIS — I10 Essential (primary) hypertension: Secondary | ICD-10-CM

## 2016-04-14 DIAGNOSIS — I130 Hypertensive heart and chronic kidney disease with heart failure and stage 1 through stage 4 chronic kidney disease, or unspecified chronic kidney disease: Secondary | ICD-10-CM | POA: Diagnosis present

## 2016-04-14 DIAGNOSIS — I429 Cardiomyopathy, unspecified: Secondary | ICD-10-CM | POA: Diagnosis not present

## 2016-04-14 DIAGNOSIS — J9600 Acute respiratory failure, unspecified whether with hypoxia or hypercapnia: Secondary | ICD-10-CM

## 2016-04-14 DIAGNOSIS — I509 Heart failure, unspecified: Secondary | ICD-10-CM

## 2016-04-14 DIAGNOSIS — Z87891 Personal history of nicotine dependence: Secondary | ICD-10-CM | POA: Diagnosis not present

## 2016-04-14 DIAGNOSIS — J811 Chronic pulmonary edema: Secondary | ICD-10-CM | POA: Diagnosis present

## 2016-04-14 DIAGNOSIS — I4729 Other ventricular tachycardia: Secondary | ICD-10-CM

## 2016-04-14 HISTORY — DX: Ventricular tachycardia, unspecified: I47.20

## 2016-04-14 HISTORY — DX: Ventricular premature depolarization: I49.3

## 2016-04-14 HISTORY — DX: Other cardiomyopathies: I42.8

## 2016-04-14 HISTORY — DX: Ventricular tachycardia: I47.2

## 2016-04-14 HISTORY — DX: Chronic systolic (congestive) heart failure: I50.22

## 2016-04-14 HISTORY — DX: Atherosclerotic heart disease of native coronary artery without angina pectoris: I25.10

## 2016-04-14 LAB — CBC
HCT: 53.7 % — ABNORMAL HIGH (ref 40.0–52.0)
Hemoglobin: 17.5 g/dL (ref 13.0–18.0)
MCH: 26.3 pg (ref 26.0–34.0)
MCHC: 32.6 g/dL (ref 32.0–36.0)
MCV: 80.6 fL (ref 80.0–100.0)
PLATELETS: 196 10*3/uL (ref 150–440)
RBC: 6.67 MIL/uL — AB (ref 4.40–5.90)
RDW: 16.5 % — AB (ref 11.5–14.5)
WBC: 11.5 10*3/uL — AB (ref 3.8–10.6)

## 2016-04-14 LAB — COMPREHENSIVE METABOLIC PANEL
ALBUMIN: 4.1 g/dL (ref 3.5–5.0)
ALT: 27 U/L (ref 17–63)
AST: 21 U/L (ref 15–41)
Alkaline Phosphatase: 55 U/L (ref 38–126)
Anion gap: 12 (ref 5–15)
BUN: 17 mg/dL (ref 6–20)
CHLORIDE: 105 mmol/L (ref 101–111)
CO2: 20 mmol/L — AB (ref 22–32)
CREATININE: 1.35 mg/dL — AB (ref 0.61–1.24)
Calcium: 9.3 mg/dL (ref 8.9–10.3)
GFR calc Af Amer: >60 mL/min (ref >60)
GFR, EST NON AFRICAN AMERICAN: 56 mL/min — AB (ref >60)
GLUCOSE: 144 mg/dL — AB (ref 65–99)
POTASSIUM: 4.1 mmol/L (ref 3.5–5.1)
Sodium: 137 mmol/L (ref 135–145)
Total Bilirubin: 2 mg/dL — ABNORMAL HIGH (ref 0.3–1.2)
Total Protein: 7.3 g/dL (ref 6.5–8.1)

## 2016-04-14 LAB — PROTIME-INR
INR: 1.19
PROTHROMBIN TIME: 15.2 s (ref 11.4–15.2)

## 2016-04-14 LAB — MRSA PCR SCREENING: MRSA by PCR: NEGATIVE

## 2016-04-14 LAB — ECHOCARDIOGRAM COMPLETE
HEIGHTINCHES: 74 in
WEIGHTICAEL: 3600 [oz_av]

## 2016-04-14 LAB — GLUCOSE, CAPILLARY
GLUCOSE-CAPILLARY: 166 mg/dL — AB (ref 65–99)
Glucose-Capillary: 147 mg/dL — ABNORMAL HIGH (ref 65–99)

## 2016-04-14 LAB — TROPONIN I
TROPONIN I: 0.03 ng/mL — AB (ref <0.03)
Troponin I: 0.03 ng/mL (ref <0.03)

## 2016-04-14 LAB — MAGNESIUM
MAGNESIUM: 1.7 mg/dL (ref 1.7–2.4)
Magnesium: 1.6 mg/dL — ABNORMAL LOW (ref 1.7–2.4)

## 2016-04-14 LAB — BRAIN NATRIURETIC PEPTIDE: B Natriuretic Peptide: 3455 pg/mL — ABNORMAL HIGH (ref 0.0–100.0)

## 2016-04-14 LAB — PHOSPHORUS: Phosphorus: 4.5 mg/dL (ref 2.5–4.6)

## 2016-04-14 MED ORDER — ACETAMINOPHEN 650 MG RE SUPP: 650.0000 mg | Freq: Four times a day (QID) | RECTAL | Status: DC | PRN

## 2016-04-14 MED ORDER — AMIODARONE HCL IN DEXTROSE 360-4.14 MG/200ML-% IV SOLN
INTRAVENOUS | Status: AC
  Filled 2016-04-14: qty 200

## 2016-04-14 MED ORDER — NITROGLYCERIN 2 % TD OINT
2.0000 [in_us] | TOPICAL_OINTMENT | Freq: Four times a day (QID) | TRANSDERMAL | Status: DC
  Administered 2016-04-14 – 2016-04-15 (×6): 2 [in_us] via TOPICAL
  Filled 2016-04-14 (×7): qty 2

## 2016-04-14 MED ORDER — FAMOTIDINE 20 MG PO TABS
20.0000 mg | ORAL_TABLET | Freq: Two times a day (BID) | ORAL | Status: DC
  Administered 2016-04-14 – 2016-04-17 (×5): 20 mg via ORAL
  Filled 2016-04-14 (×5): qty 1

## 2016-04-14 MED ORDER — ATORVASTATIN CALCIUM 20 MG PO TABS
20.0000 mg | ORAL_TABLET | Freq: Every day | ORAL | Status: DC
  Administered 2016-04-15 – 2016-04-19 (×4): 20 mg via ORAL
  Filled 2016-04-14 (×4): qty 1

## 2016-04-14 MED ORDER — MORPHINE SULFATE (PF) 2 MG/ML IV SOLN
INTRAVENOUS | Status: AC
  Filled 2016-04-14: qty 1

## 2016-04-14 MED ORDER — ASPIRIN EC 81 MG PO TBEC
81.0000 mg | DELAYED_RELEASE_TABLET | Freq: Every day | ORAL | Status: DC
  Administered 2016-04-15 – 2016-04-19 (×4): 81 mg via ORAL
  Filled 2016-04-14 (×4): qty 1

## 2016-04-14 MED ORDER — HYDRALAZINE HCL 20 MG/ML IJ SOLN
10.0000 mg | INTRAMUSCULAR | Status: DC | PRN
  Administered 2016-04-14: 10 mg via INTRAVENOUS
  Filled 2016-04-14: qty 1

## 2016-04-14 MED ORDER — POTASSIUM CHLORIDE 20 MEQ PO PACK
20.0000 meq | PACK | Freq: Two times a day (BID) | ORAL | Status: DC
  Administered 2016-04-14 – 2016-04-18 (×9): 20 meq via ORAL
  Filled 2016-04-14 (×8): qty 1

## 2016-04-14 MED ORDER — ONDANSETRON HCL 4 MG/2ML IJ SOLN: 4.0000 mg | Freq: Four times a day (QID) | INTRAMUSCULAR | Status: DC | PRN

## 2016-04-14 MED ORDER — LORAZEPAM 2 MG/ML IJ SOLN
0.5000 mg | INTRAMUSCULAR | Status: DC | PRN
  Administered 2016-04-14: 0.5 mg via INTRAVENOUS
  Filled 2016-04-14: qty 1

## 2016-04-14 MED ORDER — ONDANSETRON HCL 4 MG PO TABS: 4.0000 mg | ORAL_TABLET | Freq: Four times a day (QID) | ORAL | Status: DC | PRN

## 2016-04-14 MED ORDER — ACETAMINOPHEN 325 MG PO TABS
650.0000 mg | ORAL_TABLET | Freq: Four times a day (QID) | ORAL | Status: DC | PRN
  Administered 2016-04-14 – 2016-04-15 (×3): 650 mg via ORAL
  Filled 2016-04-14 (×3): qty 2

## 2016-04-14 MED ORDER — DIGOXIN 125 MCG PO TABS
0.1250 mg | ORAL_TABLET | ORAL | Status: DC
  Administered 2016-04-14: 0.125 mg via ORAL
  Filled 2016-04-14: qty 1

## 2016-04-14 MED ORDER — HYDRALAZINE HCL 20 MG/ML IJ SOLN
10.0000 mg | Freq: Once | INTRAMUSCULAR | Status: AC
  Administered 2016-04-14: 10 mg via INTRAVENOUS

## 2016-04-14 MED ORDER — AMIODARONE LOAD VIA INFUSION
150.0000 mg | Freq: Once | INTRAVENOUS | Status: DC
  Filled 2016-04-14: qty 83.34

## 2016-04-14 MED ORDER — SODIUM CHLORIDE 0.9 % IV SOLN
INTRAVENOUS | Status: DC
  Administered 2016-04-16 – 2016-04-19 (×3): via INTRAVENOUS

## 2016-04-14 MED ORDER — FUROSEMIDE 10 MG/ML IJ SOLN
40.0000 mg | Freq: Two times a day (BID) | INTRAMUSCULAR | Status: DC
  Administered 2016-04-14 – 2016-04-18 (×8): 40 mg via INTRAVENOUS
  Filled 2016-04-14 (×6): qty 4

## 2016-04-14 MED ORDER — METOPROLOL TARTRATE 25 MG PO TABS
25.0000 mg | ORAL_TABLET | Freq: Two times a day (BID) | ORAL | Status: DC
  Administered 2016-04-14 (×2): 25 mg via ORAL
  Filled 2016-04-14: qty 1

## 2016-04-14 MED ORDER — SODIUM CHLORIDE 0.9 % IV BOLUS (SEPSIS)
1000.0000 mL | Freq: Once | INTRAVENOUS | Status: AC
  Administered 2016-04-14: 1000 mL via INTRAVENOUS

## 2016-04-14 MED ORDER — ENALAPRIL MALEATE 10 MG PO TABS
10.0000 mg | ORAL_TABLET | Freq: Two times a day (BID) | ORAL | Status: DC
  Administered 2016-04-14 – 2016-04-17 (×5): 10 mg via ORAL
  Filled 2016-04-14 (×5): qty 1

## 2016-04-14 MED ORDER — BUDESONIDE 0.25 MG/2ML IN SUSP
0.2500 mg | Freq: Two times a day (BID) | RESPIRATORY_TRACT | Status: DC
  Administered 2016-04-14 – 2016-04-19 (×10): 0.25 mg via RESPIRATORY_TRACT
  Filled 2016-04-14 (×10): qty 2

## 2016-04-14 MED ORDER — MORPHINE SULFATE (PF) 2 MG/ML IV SOLN
2.0000 mg | Freq: Once | INTRAVENOUS | Status: AC
  Administered 2016-04-14: 2 mg via INTRAVENOUS

## 2016-04-14 MED ORDER — BISACODYL 10 MG RE SUPP: 10.0000 mg | Freq: Every day | RECTAL | Status: DC | PRN

## 2016-04-14 MED ORDER — METOPROLOL TARTRATE 25 MG PO TABS
ORAL_TABLET | ORAL | Status: AC
  Administered 2016-04-14: 25 mg via ORAL
  Filled 2016-04-14: qty 1

## 2016-04-14 MED ORDER — HYDRALAZINE HCL 20 MG/ML IJ SOLN
INTRAMUSCULAR | Status: AC
  Administered 2016-04-14: 10 mg via INTRAVENOUS
  Filled 2016-04-14: qty 1

## 2016-04-14 MED ORDER — SODIUM CHLORIDE 0.9% FLUSH
3.0000 mL | Freq: Two times a day (BID) | INTRAVENOUS | Status: DC
  Administered 2016-04-14 – 2016-04-17 (×5): 3 mL via INTRAVENOUS

## 2016-04-14 MED ORDER — FUROSEMIDE 10 MG/ML IJ SOLN
INTRAMUSCULAR | Status: AC
  Administered 2016-04-14: 40 mg via INTRAVENOUS
  Filled 2016-04-14: qty 4

## 2016-04-14 MED ORDER — AMIODARONE HCL IN DEXTROSE 360-4.14 MG/200ML-% IV SOLN
60.0000 mg/h | INTRAVENOUS | Status: AC
  Administered 2016-04-14 (×2): 60 mg/h via INTRAVENOUS
  Filled 2016-04-14: qty 200

## 2016-04-14 MED ORDER — FAMOTIDINE IN NACL 20-0.9 MG/50ML-% IV SOLN: 20.0000 mg | Freq: Two times a day (BID) | INTRAVENOUS | Status: DC

## 2016-04-14 MED ORDER — POTASSIUM CHLORIDE 20 MEQ PO PACK
PACK | ORAL | Status: AC
  Administered 2016-04-14: 20 meq via ORAL
  Filled 2016-04-14: qty 1

## 2016-04-14 MED ORDER — AMIODARONE LOAD VIA INFUSION: 150.0000 mg | Freq: Once | INTRAVENOUS | Status: DC

## 2016-04-14 MED ORDER — ENOXAPARIN SODIUM 40 MG/0.4ML ~~LOC~~ SOLN
40.0000 mg | SUBCUTANEOUS | Status: DC
  Administered 2016-04-14 – 2016-04-18 (×5): 40 mg via SUBCUTANEOUS
  Filled 2016-04-14 (×5): qty 0.4

## 2016-04-14 MED ORDER — NITROGLYCERIN 2 % TD OINT
TOPICAL_OINTMENT | TRANSDERMAL | Status: AC
  Administered 2016-04-14: 2 [in_us] via TOPICAL
  Filled 2016-04-14: qty 2

## 2016-04-14 MED ORDER — DOCUSATE SODIUM 100 MG PO CAPS
100.0000 mg | ORAL_CAPSULE | Freq: Two times a day (BID) | ORAL | Status: DC
  Administered 2016-04-14 – 2016-04-19 (×8): 100 mg via ORAL
  Filled 2016-04-14 (×9): qty 1

## 2016-04-14 MED ORDER — IPRATROPIUM-ALBUTEROL 0.5-2.5 (3) MG/3ML IN SOLN
3.0000 mL | Freq: Four times a day (QID) | RESPIRATORY_TRACT | Status: DC
  Administered 2016-04-14 – 2016-04-17 (×14): 3 mL via RESPIRATORY_TRACT
  Filled 2016-04-14 (×14): qty 3

## 2016-04-14 MED ORDER — AMIODARONE HCL IN DEXTROSE 360-4.14 MG/200ML-% IV SOLN
30.0000 mg/h | INTRAVENOUS | Status: DC
  Administered 2016-04-14 – 2016-04-15 (×2): 30 mg/h via INTRAVENOUS
  Filled 2016-04-14 (×2): qty 200

## 2016-04-14 MED ORDER — MORPHINE SULFATE (PF) 2 MG/ML IV SOLN: 2.0000 mg | INTRAVENOUS | Status: AC | PRN

## 2016-04-14 MED ORDER — AMIODARONE IV BOLUS ONLY 150 MG/100ML
INTRAVENOUS | Status: AC
  Administered 2016-04-14: 150 mg
  Filled 2016-04-14: qty 100

## 2016-04-14 NOTE — Consult Note (Addendum)
PULMONARY / CRITICAL CARE MEDICINE   Name: Gary [name] MRN: [id number] DOB: Feb 03, 1956    ADMISSION DATE:  04/14/2016  REFERRING MD:  Dr. Kerman Passey Reason for consult: Persistent hypoxia requiring BiPAP  CHIEF COMPLAINT:  Difficulty breathing  HISTORY OF PRESENT ILLNESS:   This is a 60 year old African-American male with a past medical history of cardiomyopathy, hypertension, obstructive sleep apnea, prostate cancer and CVA who presented with acute dyspnea, diaphoresis and palpitations. Patient states that symptoms started abruptly on 04/13/2016 and progressively got worse. He reports that it all started with a productive cough with clear sputum and then he started feeling his heart racing. He states that he felt like his heart will race and then stop. He denies associated chest pain, PND, nausea, vomiting and diarrhea. He denies having similar symptoms in the past. He reports taking all his medications as prescribed. Stat echo showed an EF of 15%. At the ED, his EKG showed ventricular tachycardia. He was started on amiodarone drip and was seen by cardiology. Heart rate has improved but he still reports dyspnea with minimal exertion.  PAST MEDICAL HISTORY :  He  has a past medical history of Enlarged heart; Hypertension; Prostate cancer ([place]); Sleep apnea; and Stroke ([place]).  PAST SURGICAL HISTORY: He  has a past surgical history that includes tumor removed from colon and Prostate I-125 seed implant (10/12/14).  No Known Allergies  No current facility-administered medications on file prior to encounter.    Current Outpatient Prescriptions on File Prior to Encounter  Medication Sig  . amLODipine (NORVASC) 10 MG tablet Take 10 mg by mouth daily.  Marland Kitchen aspirin 81 MG tablet Take 81 mg by mouth daily.  Marland Kitchen atorvastatin (LIPITOR) 20 MG tablet Take 20 mg by mouth daily.  . carvedilol (COREG) 25 MG tablet Take 25 mg by mouth 2 (two) times daily with a meal.  . digoxin (LANOXIN) 0.125 MG tablet  Take 0.125 mg by mouth every other day.  . enalapril (VASOTEC) 10 MG tablet Take 10 mg by mouth 2 (two) times daily.  Marland Kitchen NIFEdipine (PROCARDIA XL/ADALAT-CC) 60 MG 24 hr tablet Take 60 mg by mouth daily.  Marland Kitchen spironolactone (ALDACTONE) 25 MG tablet Take 25 mg by mouth daily.    FAMILY HISTORY:  His indicated that his mother is alive. He indicated that his father is alive. He indicated that the status of his neg hx is unknown.    SOCIAL HISTORY: He  reports that he quit smoking about 1 years ago. His smoking use included Cigarettes. He has never used smokeless tobacco. He reports that he drinks about 0.6 oz of alcohol per week . He reports that he does not use drugs.  REVIEW OF SYSTEMS:   Constitutional: Negative for fever and chills.  HENT: Negative for congestion and rhinorrhea.  Eyes: Negative for redness and visual disturbance.  Respiratory: positive for shortness of breath and cough but negative for wheezing.  Cardiovascular: Negative for chest pain but positive for palpitations.  Gastrointestinal: Negative  for nausea , vomiting and abdominal pain and loose stools Genitourinary: Negative for dysuria and urgency.  Endocrine: Denies polyuria, polyphagia and heat intolerance Musculoskeletal: Negative for myalgias and arthralgias.  Skin: Negative for pallor and wound.  Neurological: Negative for dizziness and headaches   SUBJECTIVE:    VITAL SIGNS: BP (!) 146/99   Pulse 83   Temp (!) 100.9 F (38.3 C)   Resp 20   Ht 6\' 1"  (1.854 m)   Wt 208 lb 5.4 oz (94.5 kg)  SpO2 95%   BMI 27.49 kg/m   HEMODYNAMICS:    VENTILATOR SETTINGS: FiO2 (%):  [50 %] 50 %  INTAKE / OUTPUT: I/O last 3 completed shifts: In: 1385.2 [I.V.:1385.2] Out: 500 [Urine:500]  PHYSICAL EXAMINATION: General: Well-nourished, well-developed, no distress Neuro: Alert and oriented 4, no focal deficits HEENT: Normocephalic and atraumatic, PERRLA, oral mucosa dry, trachea midline Cardiovascular: Rate  and rhythm regular, S1, S2 audible, no murmur, regurg or gallop Lungs: Mild increase in work of breathing, lungs are clear to auscultation bilaterally, mildly diminished in the bases Abdomen:  Soft, nontender, nondistended, normal bowel sounds Musculoskeletal: No visible deformities, positive range of motion in upper and lower extremities Extremities: +2 pulses, trace edema Skin: Warm and dry  LABS:  BMET  Recent Labs Lab 04/14/16 1043  NA 137  K 4.1  CL 105  CO2 20*  BUN 17  CREATININE 1.35*  GLUCOSE 144*    Electrolytes  Recent Labs Lab 04/14/16 1043 04/14/16 1617  CALCIUM 9.3  --   MG 1.6* 1.7  PHOS  --  4.5    CBC  Recent Labs Lab 04/14/16 1043  WBC 11.5*  HGB 17.5  HCT 53.7*  PLT 196    Coag's  Recent Labs Lab 04/14/16 1043  INR 1.19    Sepsis Markers No results for input(s): LATICACIDVEN, PROCALCITON, O2SATVEN in the last 168 hours.  ABG No results for input(s): PHART, PCO2ART, PO2ART in the last 168 hours.  Liver Enzymes  Recent Labs Lab 04/14/16 1043  AST 21  ALT 27  ALKPHOS 55  BILITOT 2.0*  ALBUMIN 4.1    Cardiac Enzymes  Recent Labs Lab 04/14/16 1043 04/14/16 1617  TROPONINI 0.03* <0.03    Glucose  Recent Labs Lab 04/14/16 1401 04/14/16 1501  GLUCAP 147* 166*    Imaging Dg Chest Portable 1 View  Result Date: 04/14/2016 CLINICAL DATA:  Patient with mental status change. Increased heart rate. EXAM: PORTABLE CHEST 1 VIEW COMPARISON:  Chest radiograph 04/14/2016. FINDINGS: Pacer apparatus overlies the left hemi thorax, stable cardiomegaly. Mild bilateral perihilar interstitial opacities. No pleural effusion or pneumothorax. Regional skeleton is unremarkable. IMPRESSION: Cardiomegaly.  Mild interstitial pulmonary edema. Electronically Signed   By: Lovey Newcomer M.D.   On: 04/14/2016 14:24   Dg Chest Portable 1 View  Result Date: 04/14/2016 CLINICAL DATA:  Chest pain and ventricular tachycardia. EXAM: PORTABLE CHEST 1  VIEW COMPARISON:  09/17/2008 chest radiograph FINDINGS: Upper limits normal heart size noted. Pulmonary vascular congestion is present. Defibrillator pads overlying the left chest identified. There is no evidence of focal airspace disease, pulmonary edema, suspicious pulmonary nodule/mass, pleural effusion, or pneumothorax. No acute bony abnormalities are identified. IMPRESSION: Upper limits normal heart size with pulmonary vascular congestion. Electronically Signed   By: Margarette Canada M.D.   On: 04/14/2016 11:11     STUDIES:  2-D echo: Left ventricular ejection fraction 15%, severely dilated left ventricle  CULTURES: None  ANTIBIOTICS: None  SIGNIFICANT EVENTS: 04/14/2016: ED with acute dyspnea and palpitations, admitted with sinus tachycardia and acute CHF  LINES/TUBES: Peripheral IVs  DISCUSSION: 60 year old African-American male with a history of prostate cancer, obstructive sleep apnea, hypertension and cardiomyopathy presenting with acute CHF exacerbation, wide-complex tach. Tachycardia and uncontrolled hypertension  ASSESSMENT / PLAN:  PULMONARY A: Acute respiratory distress (hypoxic) secondary to pulmonary edema History of obstructive sleep apnea on home BiPAP P:   Weaned of bipap chest x-ray PRN IV diuresis per cardiology  CARDIOVASCULAR A:  Acute CHF exacerbation-echo very low EF.  Uncontrolled hypertension Wide-complex tachycardia Dilated cardiomyopathy P:  Amiodarone infusion. Per cardiology. Currently weaning EKG when necessary IV diuresis Monitor hemodynamics per ICU protocol Beta blocker take cardiology Optimize blood pressure control with hydralazine and nitroglycerin glycerin ointment  RENAL A:   Acute on chronic kidney insufficiency (CKD II). She baseline creatinine 1.4, creatinine down to 1.2 this morning Hypomagnesemia P:   Monitor and replace electrolytes Trend creatinine Renally dose medications  GASTROINTESTINAL A:   No acute  issues P:   Nothing by mouth when on BiPAP Pepcid for GI prophylaxis  HEMATOLOGIC A:   History of prostate cancer P:  Monitor hemoglobin and hematocrit Follow-up with oncology  NEUROLOGIC A:   History of CVA P:   RASS goal:N/A Monitor neurological status Continue statin and antihypertensives   Vilinda Boehringer, MD [facility] Pager [phone] (please enter 7-digits) On Call Pager - [phone] (please enter 7-digits)

## 2016-04-14 NOTE — Consult Note (Signed)
[facility] Cardiology  CARDIOLOGY CONSULT NOTE  Patient ID: Gary [name] MRN: [id number] DOB/AGE: 1956/01/08 60 y.o.  Admit date: 04/14/2016 Referring Physician Sparks Primary Physician Clayborn Bigness Primary Cardiologist  Reason for Consultation Wide complex tachycardia  HPI: 60 year old gentleman referred for evaluation of wide complex tachycardia. The patient has a history of essential hypertension, cardiomyopathy and congestive heart failure. He presents with complaint of palpitations, heart racing and diaphoresis. He denies chest pain. The emergency room, patient was noted to be very anxious. The patient was markedly elevated. ECG revealed sinus rhythm with paroxysmal wide complex tachycardia at a rate of 200 BPM. The patient was started on amiodarone drip, with less ventricular ectopy. Patient has developed worsening shortness of breath, currently in respiratory distress. Admission labs notable for negative troponin.  Review of systems complete and found to be negative unless listed above     Past Medical History:  Diagnosis Date  . Enlarged heart   . Hypertension   . Prostate cancer ([place])   . Sleep apnea   . Stroke [facility])     Past Surgical History:  Procedure Laterality Date  . Prostate I-125 seed implant  10/12/14  . tumor removed from colon      Prescriptions Prior to Admission  Medication Sig Dispense Refill Last Dose  . amLODipine (NORVASC) 10 MG tablet Take 10 mg by mouth daily.   04/13/2016 at 0800  . aspirin 81 MG tablet Take 81 mg by mouth daily.   unknown at unknown  . atorvastatin (LIPITOR) 20 MG tablet Take 20 mg by mouth daily.   04/13/2016 at 0800  . carvedilol (COREG) 25 MG tablet Take 25 mg by mouth 2 (two) times daily with a meal.   04/13/2016 at 2200  . digoxin (LANOXIN) 0.125 MG tablet Take 0.125 mg by mouth every other day.   unknown at unknown  . enalapril (VASOTEC) 10 MG tablet Take 10 mg by mouth 2 (two) times daily.   04/13/2016 at 0800  . NIFEdipine (PROCARDIA  XL/ADALAT-CC) 60 MG 24 hr tablet Take 60 mg by mouth daily.   unknown at unknown  . spironolactone (ALDACTONE) 25 MG tablet Take 25 mg by mouth daily.   unknown at unknown   Social History   Social History  . Marital status: Single    Spouse name: N/A  . Number of children: N/A  . Years of education: N/A   Occupational History  . Not on file.   Social History Main Topics  . Smoking status: Former Smoker    Types: Cigarettes    Quit date: 04/28/2014  . Smokeless tobacco: Never Used  . Alcohol use 0.6 oz/week    1 Cans of beer per week     Comment: occassionally  . Drug use: No  . Sexual activity: Not on file   Other Topics Concern  . Not on file   Social History Narrative  . No narrative on file    Family History  Problem Relation Age of Onset  . Kidney cancer Neg Hx   . Prostate cancer Neg Hx       Review of systems complete and found to be negative unless listed above      PHYSICAL EXAM  General: Well developed, well nourished, in no acute distress HEENT:  Normocephalic and atramatic Neck:  No JVD.  Lungs: Clear bilaterally to auscultation and percussion. Heart: HRRR . Normal S1 and S2 without gallops or murmurs.  Abdomen: Bowel sounds are positive, abdomen soft and non-tender  Msk:  Back normal, normal gait. Normal strength and tone for age. Extremities: No clubbing, cyanosis or edema.   Neuro: Alert and oriented X 3. Psych:  Good affect, responds appropriately  Labs:   Lab Results  Component Value Date   WBC 11.5 (H) 04/14/2016   HGB 17.5 04/14/2016   HCT 53.7 (H) 04/14/2016   MCV 80.6 04/14/2016   PLT 196 04/14/2016    Recent Labs Lab 04/14/16 1043  NA 137  K 4.1  CL 105  CO2 20*  BUN 17  CREATININE 1.35*  CALCIUM 9.3  PROT 7.3  BILITOT 2.0*  ALKPHOS 55  ALT 27  AST 21  GLUCOSE 144*   Lab Results  Component Value Date   TROPONINI 0.03 (HH) 04/14/2016    Lab Results  Component Value Date   CHOL 144 05/04/2015   Lab Results   Component Value Date   HDL 31 (L) 05/04/2015   Lab Results  Component Value Date   LDLCALC 91 05/04/2015   Lab Results  Component Value Date   TRIG 110 05/04/2015   Lab Results  Component Value Date   CHOLHDL 4.6 05/04/2015   No results found for: LDLDIRECT    Radiology: Dg Chest Portable 1 View  Result Date: 04/14/2016 CLINICAL DATA:  Patient with mental status change. Increased heart rate. EXAM: PORTABLE CHEST 1 VIEW COMPARISON:  Chest radiograph 04/14/2016. FINDINGS: Pacer apparatus overlies the left hemi thorax, stable cardiomegaly. Mild bilateral perihilar interstitial opacities. No pleural effusion or pneumothorax. Regional skeleton is unremarkable. IMPRESSION: Cardiomegaly.  Mild interstitial pulmonary edema. Electronically Signed   By: Lovey Newcomer M.D.   On: 04/14/2016 14:24   Dg Chest Portable 1 View  Result Date: 04/14/2016 CLINICAL DATA:  Chest pain and ventricular tachycardia. EXAM: PORTABLE CHEST 1 VIEW COMPARISON:  09/17/2008 chest radiograph FINDINGS: Upper limits normal heart size noted. Pulmonary vascular congestion is present. Defibrillator pads overlying the left chest identified. There is no evidence of focal airspace disease, pulmonary edema, suspicious pulmonary nodule/mass, pleural effusion, or pneumothorax. No acute bony abnormalities are identified. IMPRESSION: Upper limits normal heart size with pulmonary vascular congestion. Electronically Signed   By: Margarette Canada M.D.   On: 04/14/2016 11:11    EKG: Sinus rhythm, with nonspecific ST abnormalities laterally, with intermittent wide complex tachycardia  ASSESSMENT AND PLAN:   1. Intermittent wide complex tachycardia, improved on amiodarone drip, in patient with history of cardiomyopathy and congestive heart failure, probable ventricular tachycardia 2. History of cardiomyopathy, with congestive heart failure, currently in respiratory distress, likely due to acute on chronic systolic congestive heart failure 3.  Essential hypertension, blood pressure markedly elevated  Recommendations  1. Agree with current therapy 2. Continue amiodarone drip 3. Add topical nitrates 4. Continue metoprolol 25 mg twice a day, up titrate as needed 5. 2-D echocardiogram 6. EP consult 7. Further recommendations pending echocardiogram results and patient's initial clinical course  Signed: Arsalan Brisbin MD,PhD, [facility] 04/14/2016, 3:03 PM

## 2016-04-14 NOTE — ED Notes (Signed)
Hydralazine administered by Sharol Roussel RN

## 2016-04-14 NOTE — ED Notes (Signed)
Patient placed on NRB for low sats.

## 2016-04-14 NOTE — ED Triage Notes (Signed)
C/O intermittent feeling of heart racing and sweating.  Onset of symptoms yesterday.

## 2016-04-14 NOTE — H&P (Signed)
History and Physical    Reco Iseminger [id number] DOB: 1955-09-15 DOA: 04/14/2016  Referring physician: Dr. Kerman Passey PCP: Lavera Guise, MD  Specialists: none  Chief Complaint: palpitations and sweats  HPI: Gary [name] is a 60 y.o. male has a past medical history significant for HTN, CHF, and cardiomyopathy now with tachypalpitations and sweats. Denies CP or SOB. Very anxious. In ER, BP elevated. Found to be in a ventricular tachycardia and started on Amiodarone. He is now admitted.  Review of Systems: The patient denies anorexia, fever, weight loss,, vision loss, decreased hearing, hoarseness, chest pain, syncope, dyspnea on exertion, peripheral edema, balance deficits, hemoptysis, abdominal pain, melena, hematochezia, severe indigestion/heartburn, hematuria, incontinence, genital sores, muscle weakness, suspicious skin lesions, transient blindness, difficulty walking, depression, unusual weight change, abnormal bleeding, enlarged lymph nodes, angioedema, and breast masses.   Past Medical History:  Diagnosis Date  . Enlarged heart   . Hypertension   . Prostate cancer ([place])   . Sleep apnea   . Stroke [facility])    Past Surgical History:  Procedure Laterality Date  . Prostate I-125 seed implant  10/12/14  . tumor removed from colon     Social History:  reports that he quit smoking about 1 years ago. His smoking use included Cigarettes. He has never used smokeless tobacco. He reports that he drinks about 0.6 oz of alcohol per week . He reports that he does not use drugs.  No Known Allergies  Family History  Problem Relation Age of Onset  . Kidney cancer Neg Hx   . Prostate cancer Neg Hx     Prior to Admission medications   Medication Sig Start Date End Date Taking? Authorizing Provider  amLODipine (NORVASC) 10 MG tablet Take 10 mg by mouth daily.    Historical Provider, MD  aspirin 81 MG tablet Take 81 mg by mouth daily.    Historical Provider, MD  atorvastatin (LIPITOR)  20 MG tablet Take 20 mg by mouth daily.    Historical Provider, MD  carvedilol (COREG) 25 MG tablet Take 25 mg by mouth 2 (two) times daily with a meal.    Historical Provider, MD  digoxin (LANOXIN) 0.125 MG tablet Take 0.125 mg by mouth every other day.    Historical Provider, MD  enalapril (VASOTEC) 10 MG tablet Take 10 mg by mouth 2 (two) times daily.    Historical Provider, MD  NIFEdipine (PROCARDIA XL/ADALAT-CC) 60 MG 24 hr tablet Take 60 mg by mouth daily.    Historical Provider, MD  spironolactone (ALDACTONE) 25 MG tablet Take 25 mg by mouth daily.    Historical Provider, MD   Physical Exam: Vitals:   04/14/16 1029 04/14/16 1120  BP:  (!) 130/101  Pulse:  (!) 101  Resp:  (!) 22  SpO2:  94%  Weight: 102.1 kg (225 lb)   Height: 6\' 2"  (1.88 m)      General:  No apparent distress, WDWN, [place]/AT  Eyes: PERRL, EOMI, no scleral icterus, conjunctiva clear  ENT: moist oropharynx without exudate, TM's benign, dentition good  Neck: supple, no lymphadenopathy. No bruits or thyromegaly  Cardiovascular: rapid rate with regular rhythm without MRG; 2+ peripheral pulses, no JVD, trace peripheral edema  Respiratory: basilar rales without wheezes or rhonchi. Respiratory effort increased.  Abdomen: soft, non tender to palpation, positive bowel sounds, no guarding, no rebound  Skin: no rashes or lesions  Musculoskeletal: normal bulk and tone, no joint swelling  Psychiatric: normal mood and affect, A&OX3  Neurologic: CN 2-12  grossly intact, Motor strength 5/5 in all 4 groups with symmetric DTR's and non-focal sensory exam  Labs on Admission:  Basic Metabolic Panel:  Recent Labs Lab 04/14/16 1043  NA 137  K 4.1  CL 105  CO2 20*  GLUCOSE 144*  BUN 17  CREATININE 1.35*  CALCIUM 9.3  MG 1.6*   Liver Function Tests:  Recent Labs Lab 04/14/16 1043  AST 21  ALT 27  ALKPHOS 55  BILITOT 2.0*  PROT 7.3  ALBUMIN 4.1   No results for input(s): LIPASE, AMYLASE in the last 168  hours. No results for input(s): AMMONIA in the last 168 hours. CBC:  Recent Labs Lab 04/14/16 1043  WBC 11.5*  HGB 17.5  HCT 53.7*  MCV 80.6  PLT 196   Cardiac Enzymes:  Recent Labs Lab 04/14/16 1043  TROPONINI 0.03*    BNP (last 3 results) No results for input(s): BNP in the last 8760 hours.  ProBNP (last 3 results) No results for input(s): PROBNP in the last 8760 hours.  CBG: No results for input(s): GLUCAP in the last 168 hours.  Radiological Exams on Admission: Dg Chest Portable 1 View  Result Date: 04/14/2016 CLINICAL DATA:  Chest pain and ventricular tachycardia. EXAM: PORTABLE CHEST 1 VIEW COMPARISON:  09/17/2008 chest radiograph FINDINGS: Upper limits normal heart size noted. Pulmonary vascular congestion is present. Defibrillator pads overlying the left chest identified. There is no evidence of focal airspace disease, pulmonary edema, suspicious pulmonary nodule/mass, pleural effusion, or pneumothorax. No acute bony abnormalities are identified. IMPRESSION: Upper limits normal heart size with pulmonary vascular congestion. Electronically Signed   By: Margarette Canada M.D.   On: 04/14/2016 11:11    EKG: Independently reviewed.  Assessment/Plan Principal Problem:   Ventricular tachycardia (HCC) Active Problems:   CHF (congestive heart failure) (HCC)   Accelerated hypertension   HLD (hyperlipidemia)   Will admit to step down on Amiodarone drip. Add lopressor, NTP, and IV Lasix. PRN Hydralazine. Follow enzymes. Order echo and Cardiology consult. Repeat labs and CXR in AM.  Diet: low salt Fluids: NS@50  DVT Prophylaxis: Lovenox  Code Status: FULL  Family Communication: yes  Disposition Plan: home  Time spent: 50 min

## 2016-04-14 NOTE — ED Provider Notes (Addendum)
[facility] Emergency Department Provider Note  Time seen: 10:43 AM  I have reviewed the triage vital signs and the nursing notes.   HISTORY  Chief Complaint Palpitations    HPI Gary [name] is a 60 y.o. male with a past medical history of hypertension, CVA, who presents the emergency department with a very fast heartbeat/palpitations. According to the patient for the past several days he has been coughing productive of clear mucus. Denies fever. Patient states since last night he has felt his heart beating very fast and then it stops and then it starts and then stops. Patient states today it has been much worse, longer episodes. Patient denies chest pain states he does feel somewhat short of breath when his heart is beating fast, denies nausea or diaphoresis. Patient denies any cardiac history such as MI or stents.  Past Medical History:  Diagnosis Date  . Enlarged heart   . Hypertension   . Prostate cancer ([place])   . Sleep apnea   . Stroke [facility])     There are no active problems to display for this patient.   Past Surgical History:  Procedure Laterality Date  . Prostate I-125 seed implant  10/12/14  . tumor removed from colon      Prior to Admission medications   Medication Sig Start Date End Date Taking? Authorizing Provider  amLODipine (NORVASC) 10 MG tablet Take 10 mg by mouth daily.    Historical Provider, MD  aspirin 81 MG tablet Take 81 mg by mouth daily.    Historical Provider, MD  atorvastatin (LIPITOR) 20 MG tablet Take 20 mg by mouth daily.    Historical Provider, MD  carvedilol (COREG) 25 MG tablet Take 25 mg by mouth 2 (two) times daily with a meal.    Historical Provider, MD  digoxin (LANOXIN) 0.125 MG tablet Take 0.125 mg by mouth every other day.    Historical Provider, MD  enalapril (VASOTEC) 10 MG tablet Take 10 mg by mouth 2 (two) times daily.    Historical Provider, MD  NIFEdipine (PROCARDIA XL/ADALAT-CC) 60 MG 24 hr tablet Take 60  mg by mouth daily.    Historical Provider, MD  spironolactone (ALDACTONE) 25 MG tablet Take 25 mg by mouth daily.    Historical Provider, MD    No Known Allergies  Family History  Problem Relation Age of Onset  . Kidney cancer Neg Hx   . Prostate cancer Neg Hx     Social History Social History  Substance Use Topics  . Smoking status: Former Smoker    Types: Cigarettes    Quit date: 04/28/2014  . Smokeless tobacco: Never Used  . Alcohol use 0.6 oz/week    1 Cans of beer per week     Comment: occassionally    Review of Systems Constitutional: Negative for fever. Cardiovascular: Negative for chest pain.Positive for palpitations/rapid heartbeat. Respiratory: Shortness of breath with fast heart rate Gastrointestinal: Negative for abdominal pain. Musculoskeletal: Negative for back pain. Neurological: Negative for headache 10-point ROS otherwise negative.  ____________________________________________   PHYSICAL EXAM:  VITAL SIGNS: ED Triage Vitals  Enc Vitals Group     BP --      Pulse --      Resp --      Temp --      Temp src --      SpO2 --      Weight 04/14/16 1029 225 lb (102.1 kg)     Height 04/14/16 1029 6\' 2"  (1.88  m)     Head Circumference --      Peak Flow --      Pain Score 04/14/16 1028 0     Pain Loc --      Pain Edu? --      Excl. in [place]? --     Constitutional: Alert and oriented. Well appearing and in no distress. Eyes: Normal exam ENT   Head: Normocephalic and atraumatic.   Mouth/Throat: Mucous membranes are moist. Cardiovascular: Regular rhythm, rate around 100 bpm. Patient will go into a rhythm most consistent with ventricular tachycardia around 220 bpm, and then self convert back to normal sinus rhythm. Respiratory: Normal respiratory effort without tachypnea nor retractions. Breath sounds are clear  Gastrointestinal: Soft and nontender. No distention.  Musculoskeletal: Nontender with normal range of motion in all extremities. No lower  extremity tenderness Neurologic:  Normal speech and language. No gross focal neurologic deficits  Skin:  Skin is warm, dry and intact.  Psychiatric: Mood and affect are normal.   ____________________________________________    EKG  EKG reviewed and interpreted by myself shows sinus tachycardia initially, with frequent PVCs during the EKG the patient converts to a wide complex tachycardia most consistent with ventricular tachycardia. Nonspecific ST changes.  Second EKG 10:33:25 view and interpreted by myself during an episode without ventricular tachycardia shows sinus tachycardia 108 bpm with frequent PVCs, widened QRS with normal axis, largely normal intervals with nonspecific ST changes mild ST depressions in the lateral leads. No concerning ST elevation noted.  ____________________________________________    RADIOLOGY  Mild vascular congestion otherwise negative on x-ray.  ____________________________________________   INITIAL IMPRESSION / ASSESSMENT AND PLAN / ED COURSE  Pertinent labs & imaging results that were available during my care of the patient were reviewed by me and considered in my medical decision making (see chart for details).  Patient presents the emergency department with palpitations after having cough with mucus/sputum production for the past 3 days. Patient appears to be an intermittent ventricular tachycardia. Patient is able to perform a Valsalva maneuver each time his heart goes into this arrhythmia and can break himself back into a sinus tachycardia. EKG between episodes of ventricular tachycardia show a sinus tachycardia with nonspecific ST changes mild ST depressions in lateral leads likely consistent with demand ischemia, no evidence for ST elevation MI. We'll check labs, IV hydrate. I ordered an amiodarone bolus and amiodarone drip. Patient denies any chest pain. States he does feel short of breath during episodes of fast heartbeat. Patient will definitely  require admission to the hospital for further treatment. Currently awaiting lab results. Amiodarone currently infusing.  Patient currently sinus tachycardia around 100-110 bpm, occasionally will go into a wide complex tachycardia however it is very brief, definite much improved from earlier before amiodarone infusion. I discussed the patient with Dr.Paraschos, Dr.Kasa, and Dr. Doy Hutching, the patient will be admitted to the stepdown area for further treatment.   CRITICAL CARE Performed by: Harvest Dark   Total critical care time: 90 minutes  Critical care time was exclusive of separately billable procedures and treating other patients.  Critical care was necessary to treat or prevent imminent or life-threatening deterioration.  Critical care was time spent personally by me on the following activities: development of treatment plan with patient and/or surrogate as well as nursing, discussions with consultants, evaluation of patient's response to treatment, examination of patient, obtaining history from patient or surrogate, ordering and performing treatments and interventions, ordering and review of laboratory studies, ordering and  review of radiographic studies, pulse oximetry and re-evaluation of patient's condition.  ----------------------------------------- 2:07 PM on 04/14/2016 -----------------------------------------  Patient has acutely decompensated, blood pressure elevated 180/130. Patient satting 80% on room air extremely diaphoretic, labored breathing. Patient with diffuse rales on auscultation. Likely flash pulmonary edema. Chest x-ray consistent with pulmonary edema. Repeat EKG shows no signs of ST elevation. Patient received 10 mg IV hydralazine, placed on BiPAP. Appears somewhat improved on BiPAP. I will discuss with the hospitalist to arrange  ICU admission for the patient.  Repeat EKG 13:57:32 shows sinus tachycardia 112 bpm with a widened QRS complex, normal axis,  nonspecific ST changes. No obvious ST elevation, considerable electrical interference as the patient is having moderate difficulty breathing at this time.   ----------------------------------------- 2:30 PM on 04/14/2016 -----------------------------------------  Patient much improved at this time, answering questions without difficulty, states he feels much better. No longer diaphoretic. We'll admit to the ICU. ____________________________________________   FINAL CLINICAL IMPRESSION(S) / ED DIAGNOSES  Ventricular tachycardia Pulmonary edema    Harvest Dark, MD 04/14/16 1212    Harvest Dark, MD 04/14/16 1411    Harvest Dark, MD 04/14/16 1431

## 2016-04-14 NOTE — ED Notes (Signed)
Morphine given to address work of breathing. Patient denies pain at this time.

## 2016-04-14 NOTE — ED Triage Notes (Signed)
Pt reports yesterday noticed his heart beating fast and he was sweating.  Pt denies any chest pain.

## 2016-04-14 NOTE — Progress Notes (Addendum)
[facility] consulted to monitor amiodarone drug interactions in this 41 yoM who has a past medical history significant for HTN, CHF, and cardiomyopathy now with tachypalpitations and sweats. Pt was found to be in ventricular tachycardia and started on Amiodarone.  Assessment/Plan  1. Amiodarone drug interactions - patient was taking digoxin prior to admission but has not been restarted on this medication. Amiodarone could potentially increase the effects of metoprolol but pt is currently tachycardiac. No other medication adjustments needed. Pharmacy will continue to monitor.  No Known Allergies  Patient Measurements: Height: 6\' 2"  (188 cm) Weight: 225 lb (102.1 kg) IBW/kg (Calculated) : 82.2  Vital Signs: BP: 147/112 (09/17 1231) Pulse Rate: 108 (09/17 1231) Intake/Output from previous day: No intake/output data recorded. Intake/Output from this shift: Total I/O In: 1000 [I.V.:1000] Out: -   Labs:  Recent Labs  04/14/16 1043  WBC 11.5*  HGB 17.5  HCT 53.7*  PLT 196  INR 1.19     Recent Labs  04/14/16 1043  NA 137  K 4.1  CL 105  CO2 20*  GLUCOSE 144*  BUN 17  CREATININE 1.35*  CALCIUM 9.3  MG 1.6*  PROT 7.3  ALBUMIN 4.1  AST 21  ALT 27  ALKPHOS 55  BILITOT 2.0*   Estimated Creatinine Clearance: 74.2 mL/min (by C-G formula based on SCr of 1.35 mg/dL (H)).   No results for input(s): GLUCAP in the last 72 hours.  Medications:  Scheduled:  . amiodarone  150 mg Intravenous Once  . furosemide  40 mg Intravenous Q12H  . metoprolol tartrate  25 mg Oral BID  . nitroGLYCERIN  2 inch Topical Q6H  . potassium chloride  20 mEq Oral BID   Infusions:  . sodium chloride    . amiodarone 60 mg/hr (04/14/16 1049)   Followed by  . amiodarone       Darrow Bussing, PharmD Pharmacy Resident 04/14/2016 1:46 PM

## 2016-04-14 NOTE — ED Notes (Signed)
Dr. Kerman Passey called to the room for patient experiencing sats in low 80's on NRB. Respiratory called for Bipap. EKG obtained. Portable chest order placed. Patient has audible wheezing, appears anxious.

## 2016-04-14 NOTE — ED Notes (Signed)
Dr Sparks at bedside.  

## 2016-04-15 ENCOUNTER — Inpatient Hospital Stay: Payer: Medicare Other

## 2016-04-15 DIAGNOSIS — E785 Hyperlipidemia, unspecified: Secondary | ICD-10-CM

## 2016-04-15 DIAGNOSIS — I5043 Acute on chronic combined systolic (congestive) and diastolic (congestive) heart failure: Secondary | ICD-10-CM

## 2016-04-15 DIAGNOSIS — I472 Ventricular tachycardia: Principal | ICD-10-CM

## 2016-04-15 DIAGNOSIS — R0600 Dyspnea, unspecified: Secondary | ICD-10-CM

## 2016-04-15 LAB — URINALYSIS COMPLETE WITH MICROSCOPIC (ARMC ONLY)
BILIRUBIN URINE: NEGATIVE
Bacteria, UA: NONE SEEN
GLUCOSE, UA: NEGATIVE mg/dL
KETONES UR: NEGATIVE mg/dL
Leukocytes, UA: NEGATIVE
Nitrite: NEGATIVE
Protein, ur: NEGATIVE mg/dL
Specific Gravity, Urine: 1.005 (ref 1.005–1.030)
Squamous Epithelial / LPF: NONE SEEN
pH: 5 (ref 5.0–8.0)

## 2016-04-15 LAB — COMPREHENSIVE METABOLIC PANEL
ALBUMIN: 4 g/dL (ref 3.5–5.0)
ALK PHOS: 50 U/L (ref 38–126)
ALT: 77 U/L — AB (ref 17–63)
AST: 60 U/L — AB (ref 15–41)
Anion gap: 12 (ref 5–15)
BILIRUBIN TOTAL: 1.4 mg/dL — AB (ref 0.3–1.2)
BUN: 23 mg/dL — AB (ref 6–20)
CO2: 25 mmol/L (ref 22–32)
CREATININE: 1.27 mg/dL — AB (ref 0.61–1.24)
Calcium: 9 mg/dL (ref 8.9–10.3)
Chloride: 101 mmol/L (ref 101–111)
GFR calc Af Amer: >60 mL/min (ref >60)
GFR, EST NON AFRICAN AMERICAN: 60 mL/min — AB (ref >60)
GLUCOSE: 104 mg/dL — AB (ref 65–99)
Potassium: 4.3 mmol/L (ref 3.5–5.1)
Sodium: 138 mmol/L (ref 135–145)
TOTAL PROTEIN: 7.4 g/dL (ref 6.5–8.1)

## 2016-04-15 LAB — TSH: TSH: 2.654 u[IU]/mL (ref 0.350–4.500)

## 2016-04-15 LAB — CBC
HCT: 53.5 % — ABNORMAL HIGH (ref 40.0–52.0)
Hemoglobin: 17.7 g/dL (ref 13.0–18.0)
MCH: 26.7 pg (ref 26.0–34.0)
MCHC: 33.2 g/dL (ref 32.0–36.0)
MCV: 80.4 fL (ref 80.0–100.0)
PLATELETS: 186 10*3/uL (ref 150–440)
RBC: 6.65 MIL/uL — AB (ref 4.40–5.90)
RDW: 16.3 % — AB (ref 11.5–14.5)
WBC: 8.3 10*3/uL (ref 3.8–10.6)

## 2016-04-15 LAB — TROPONIN I: Troponin I: 0.04 ng/mL (ref <0.03)

## 2016-04-15 LAB — MAGNESIUM: MAGNESIUM: 1.6 mg/dL — AB (ref 1.7–2.4)

## 2016-04-15 LAB — PHOSPHORUS: Phosphorus: 4.1 mg/dL (ref 2.5–4.6)

## 2016-04-15 MED ORDER — CARVEDILOL 3.125 MG PO TABS
3.1250 mg | ORAL_TABLET | Freq: Two times a day (BID) | ORAL | Status: DC
  Administered 2016-04-15: 3.125 mg via ORAL
  Filled 2016-04-15: qty 1

## 2016-04-15 MED ORDER — MAGNESIUM OXIDE 400 (241.3 MG) MG PO TABS
400.0000 mg | ORAL_TABLET | Freq: Two times a day (BID) | ORAL | Status: AC
  Administered 2016-04-15 (×2): 400 mg via ORAL
  Filled 2016-04-15 (×2): qty 1

## 2016-04-15 MED ORDER — MAGNESIUM SULFATE IN D5W 1-5 GM/100ML-% IV SOLN
1.0000 g | Freq: Once | INTRAVENOUS | Status: DC
  Filled 2016-04-15: qty 100

## 2016-04-15 MED ORDER — MAGNESIUM OXIDE 400 (241.3 MG) MG PO TABS: 400.0000 mg | ORAL_TABLET | Freq: Two times a day (BID) | ORAL | Status: DC

## 2016-04-15 MED ORDER — MAGNESIUM OXIDE 400 (241.3 MG) MG PO TABS: 800.0000 mg | ORAL_TABLET | Freq: Two times a day (BID) | ORAL | Status: DC

## 2016-04-15 MED ORDER — CARVEDILOL 6.25 MG PO TABS
6.2500 mg | ORAL_TABLET | Freq: Two times a day (BID) | ORAL | Status: DC
  Administered 2016-04-15 – 2016-04-17 (×5): 6.25 mg via ORAL
  Filled 2016-04-15 (×5): qty 1

## 2016-04-15 NOTE — Progress Notes (Signed)
Called report to RN on 2A for patient transfer to room 248 2a. Report given verbally over the phone. Patient transported via wheelchair by Baker Janus NT to 2a. Patient in stable condition.

## 2016-04-15 NOTE — Progress Notes (Signed)
[facility] Cardiology  SUBJECTIVE: I feel much better   Vitals:   04/15/16 0600 04/15/16 0700 04/15/16 0800 04/15/16 0801  BP: (!) 126/93 (!) 137/94 (!) 143/98   Pulse: 82 67 80   Resp: 20 19 (!) 22   Temp: 99.3 F (37.4 C) 99.3 F (37.4 C) 99.5 F (37.5 C) 97.5 F (36.4 C)  TempSrc: Rectal Rectal Rectal Oral  SpO2: 99% 99% 97%   Weight:      Height:         Intake/Output Summary (Last 24 hours) at 04/15/16 [id number] Last data filed at 04/15/16 0800  Gross per 24 hour  Intake          1652.26 ml  Output             1500 ml  Net           152.26 ml      PHYSICAL EXAM  General: Well developed, well nourished, in no acute distress HEENT:  Normocephalic and atramatic Neck:  No JVD.  Lungs: Clear bilaterally to auscultation and percussion. Heart: HRRR . Normal S1 and S2 without gallops or murmurs.  Abdomen: Bowel sounds are positive, abdomen soft and non-tender  Msk:  Back normal, normal gait. Normal strength and tone for age. Extremities: No clubbing, cyanosis or edema.   Neuro: Alert and oriented X 3. Psych:  Good affect, responds appropriately   LABS: Basic Metabolic Panel:  Recent Labs  04/14/16 1043 04/14/16 1617 04/15/16 0610  NA 137  --  138  K 4.1  --  4.3  CL 105  --  101  CO2 20*  --  25  GLUCOSE 144*  --  104*  BUN 17  --  23*  CREATININE 1.35*  --  1.27*  CALCIUM 9.3  --  9.0  MG 1.6* 1.7 1.6*  PHOS  --  4.5 4.1   Liver Function Tests:  Recent Labs  04/14/16 1043 04/15/16 0610  AST 21 60*  ALT 27 77*  ALKPHOS 55 50  BILITOT 2.0* 1.4*  PROT 7.3 7.4  ALBUMIN 4.1 4.0   No results for input(s): LIPASE, AMYLASE in the last 72 hours. CBC:  Recent Labs  04/14/16 1043 04/15/16 0516  WBC 11.5* 8.3  HGB 17.5 17.7  HCT 53.7* 53.5*  MCV 80.6 80.4  PLT 196 186   Cardiac Enzymes:  Recent Labs  04/14/16 1617 04/14/16 2303 04/15/16 0610  TROPONINI <0.03 0.03* 0.04*   BNP: Invalid input(s): POCBNP D-Dimer: No results for input(s): DDIMER  in the last 72 hours. Hemoglobin A1C: No results for input(s): HGBA1C in the last 72 hours. Fasting Lipid Panel: No results for input(s): CHOL, HDL, LDLCALC, TRIG, CHOLHDL, LDLDIRECT in the last 72 hours. Thyroid Function Tests:  Recent Labs  04/15/16 0610  TSH 2.654   Anemia Panel: No results for input(s): VITAMINB12, FOLATE, FERRITIN, TIBC, IRON, RETICCTPCT in the last 72 hours.  Portable Chest 1 View  Result Date: 04/15/2016 CLINICAL DATA:  60 year old male with pulmonary edema suspected on chest radiographs performed for altered mental status and tachycardia. Initial encounter. EXAM: PORTABLE CHEST 1 VIEW COMPARISON:  04/14/2016 and earlier. FINDINGS: Portable AP upright view at 0532 hours. Stable cardiomegaly and mediastinal contours. Regressed pulmonary vascularity. Now allowing for portable technique the lungs are clear. No pneumothorax. No pleural effusion identified. Calcified aortic atherosclerosis. IMPRESSION: Resolved vascular congestion/interstitial edema. No acute cardiopulmonary abnormality. Cardiomegaly and Calcified aortic atherosclerosis. Electronically Signed   By: Genevie Ann M.D.   On:  04/15/2016 07:44   Dg Chest Portable 1 View  Result Date: 04/14/2016 CLINICAL DATA:  Patient with mental status change. Increased heart rate. EXAM: PORTABLE CHEST 1 VIEW COMPARISON:  Chest radiograph 04/14/2016. FINDINGS: Pacer apparatus overlies the left hemi thorax, stable cardiomegaly. Mild bilateral perihilar interstitial opacities. No pleural effusion or pneumothorax. Regional skeleton is unremarkable. IMPRESSION: Cardiomegaly.  Mild interstitial pulmonary edema. Electronically Signed   By: Lovey Newcomer M.D.   On: 04/14/2016 14:24   Dg Chest Portable 1 View  Result Date: 04/14/2016 CLINICAL DATA:  Chest pain and ventricular tachycardia. EXAM: PORTABLE CHEST 1 VIEW COMPARISON:  09/17/2008 chest radiograph FINDINGS: Upper limits normal heart size noted. Pulmonary vascular congestion is  present. Defibrillator pads overlying the left chest identified. There is no evidence of focal airspace disease, pulmonary edema, suspicious pulmonary nodule/mass, pleural effusion, or pneumothorax. No acute bony abnormalities are identified. IMPRESSION: Upper limits normal heart size with pulmonary vascular congestion. Electronically Signed   By: Margarette Canada M.D.   On: 04/14/2016 11:11     Echo severe dilated cardiomyopathy, with LV ejection fraction 15%  TELEMETRY: Normal sinus rhythm:  ASSESSMENT AND PLAN:  Principal Problem:   Ventricular tachycardia (HCC) Active Problems:   CHF (congestive heart failure) (HCC)   Accelerated hypertension   HLD (hyperlipidemia)    1. Acute on chronic systolic congestive heart failure 2. Severe dilated cardiomyopathy 3. Ventricular tachycardia, stabilized on amiodarone drip 4. Essential hypertension  Recommendations  1. Continue diuresis 2. Change metoprolol titrate to carvedilol 3. Right and left heart cardiac catheterization scheduled for 04/16/2016 4. Discuss with Dr. Virl Axe about potential BiV ICD   Isaias Cowman, MD, PhD, [facility] 04/15/2016 8:39 AM

## 2016-04-15 NOTE — Progress Notes (Signed)
Contacted by remote tele because pt was having runs of vtach at 1449 and 1459 total of 30 runs Dr Mamie Nick aware and no action taken.

## 2016-04-15 NOTE — Progress Notes (Signed)
[name] [facility] [place] NAME: Gary [name]    MR#:  [id number]  DATE OF BIRTH:  08/17/1955  SUBJECTIVE:  SOb improved continues have some palpitations no chest pain  REVIEW OF SYSTEMS:    Review of Systems  Constitutional: Negative.  Negative for chills, fever and malaise/fatigue.  HENT: Negative.  Negative for ear discharge, ear pain, hearing loss, nosebleeds and sore throat.   Eyes: Negative.  Negative for blurred vision and pain.  Respiratory: Negative.  Negative for cough, hemoptysis, shortness of breath and wheezing.   Cardiovascular: Negative.  Negative for chest pain, palpitations and leg swelling.  Gastrointestinal: Negative.  Negative for abdominal pain, blood in stool, diarrhea, nausea and vomiting.  Genitourinary: Negative.  Negative for dysuria.  Musculoskeletal: Negative.  Negative for back pain.  Skin: Negative.   Neurological: Negative for dizziness, tremors, speech change, focal weakness, seizures and headaches.  Endo/Heme/Allergies: Negative.  Does not bruise/bleed easily.  Psychiatric/Behavioral: Negative.  Negative for depression, hallucinations and suicidal ideas.    Tolerating Diet:yes      DRUG ALLERGIES:  No Known Allergies  VITALS:  Blood pressure (!) 144/110, pulse 82, temperature 99.5 F (37.5 C), temperature source Rectal, resp. rate (!) 22, height 6\' 1"  (1.854 m), weight 93.6 kg (206 lb 5.6 oz), SpO2 95 %.  PHYSICAL EXAMINATION:   Physical Exam  Constitutional: He is oriented to person, place, and time and well-developed, well-nourished, and in no distress. No distress.  HENT:  Head: Normocephalic.  Eyes: No scleral icterus.  Neck: Normal range of motion. Neck supple. No JVD present. No tracheal deviation present.  Cardiovascular: Normal rate, regular rhythm and normal heart sounds.  Exam reveals no gallop and no friction rub.   No murmur heard. Pulmonary/Chest: Effort normal and breath sounds  normal. No respiratory distress. He has no wheezes. He has no rales. He exhibits no tenderness.  Abdominal: Soft. Bowel sounds are normal. He exhibits no distension and no mass. There is no tenderness. There is no rebound and no guarding.  Musculoskeletal: Normal range of motion. He exhibits no edema.  Neurological: He is alert and oriented to person, place, and time.  Skin: Skin is warm. No rash noted. No erythema.  Psychiatric: Affect and judgment normal.      LABORATORY PANEL:   CBC  Recent Labs Lab 04/15/16 0516  WBC 8.3  HGB 17.7  HCT 53.5*  PLT 186   ------------------------------------------------------------------------------------------------------------------  Chemistries   Recent Labs Lab 04/15/16 0610  NA 138  K 4.3  CL 101  CO2 25  GLUCOSE 104*  BUN 23*  CREATININE 1.27*  CALCIUM 9.0  MG 1.6*  AST 60*  ALT 77*  ALKPHOS 50  BILITOT 1.4*   ------------------------------------------------------------------------------------------------------------------  Cardiac Enzymes  Recent Labs Lab 04/14/16 1617 04/14/16 2303 04/15/16 0610  TROPONINI <0.03 0.03* 0.04*   ------------------------------------------------------------------------------------------------------------------  RADIOLOGY:  Portable Chest 1 View  Result Date: 04/15/2016 CLINICAL DATA:  60 year old male with pulmonary edema suspected on chest radiographs performed for altered mental status and tachycardia. Initial encounter. EXAM: PORTABLE CHEST 1 VIEW COMPARISON:  04/14/2016 and earlier. FINDINGS: Portable AP upright view at 0532 hours. Stable cardiomegaly and mediastinal contours. Regressed pulmonary vascularity. Now allowing for portable technique the lungs are clear. No pneumothorax. No pleural effusion identified. Calcified aortic atherosclerosis. IMPRESSION: Resolved vascular congestion/interstitial edema. No acute cardiopulmonary abnormality. Cardiomegaly and Calcified aortic  atherosclerosis. Electronically Signed   By: Genevie Ann M.D.   On: 04/15/2016 07:44  Dg Chest Portable 1 View  Result Date: 04/14/2016 CLINICAL DATA:  Patient with mental status change. Increased heart rate. EXAM: PORTABLE CHEST 1 VIEW COMPARISON:  Chest radiograph 04/14/2016. FINDINGS: Pacer apparatus overlies the left hemi thorax, stable cardiomegaly. Mild bilateral perihilar interstitial opacities. No pleural effusion or pneumothorax. Regional skeleton is unremarkable. IMPRESSION: Cardiomegaly.  Mild interstitial pulmonary edema. Electronically Signed   By: Lovey Newcomer M.D.   On: 04/14/2016 14:24   Dg Chest Portable 1 View  Result Date: 04/14/2016 CLINICAL DATA:  Chest pain and ventricular tachycardia. EXAM: PORTABLE CHEST 1 VIEW COMPARISON:  09/17/2008 chest radiograph FINDINGS: Upper limits normal heart size noted. Pulmonary vascular congestion is present. Defibrillator pads overlying the left chest identified. There is no evidence of focal airspace disease, pulmonary edema, suspicious pulmonary nodule/mass, pleural effusion, or pneumothorax. No acute bony abnormalities are identified. IMPRESSION: Upper limits normal heart size with pulmonary vascular congestion. Electronically Signed   By: Margarette Canada M.D.   On: 04/14/2016 11:11     ASSESSMENT AND PLAN:    60 year old male with a history of cardiomyopathy EF 15%and essential hypertension who presents with shortness of breath with palpitations and diaphoresis and found to have wide complex tachycardia.  1. Intermittent wide complex tachycardia/V. tach initially at a rate of 200 bpmith history of cardiomyopathy: Patient was started on amiodarone drip. Patient will continue amiodarone drip.  Patient is to be evaluated by EP physicianfor possible AICD. Continue Coreg. Patient also digoxin. Appreciate cardiology evaluation Plan for cardiac catheterization in a.m. Keep magnesium level at 2 potassium Continue telemetry monitor.  2. Acute on  chronic systolic heart failure with ejection fraction of15%:  Continue Lasix, Coreg,enalapril  3. Accelerated on essential hypertension: Continue enalapril and Coreg. Monitor diastolicblood pressure  4. Hypo-magnesium: Continue magnesium supplementation.  Management plans discussed with the patient and he is in agreement.   Rounded with nursing staff  CODE STATUS: full  Critical careTOTAL TIME TAKING CARE OF THIS PATIENT: 35 minutes.  Due to extremely low ejection fraction and V. Tach patient is at risk ofcardiac arrest.   POSSIBLE D/C 3-5 days, DEPENDING ON CLINICAL CONDITION.   Antoniette Peake M.D on 04/15/2016 at 12:03 PM  Between 7am to 6pm - Pager - [phone] After 6pm go to [email or web address] - password [email or web address]  Office  [phone]  CC: Primary care physician; Lavera Guise, MD  Note: This dictation was prepared with Dragon dictation along with smaller phrase technology. Any transcriptional errors that result from this process are unintentional.

## 2016-04-15 NOTE — Progress Notes (Addendum)
PULMONARY / CRITICAL CARE MEDICINE   Name: Gary [name] MRN: [id number] DOB: [birth year]    ADMISSION DATE:  04/14/2016  REFERRING MD:  Dr. Kerman Passey Reason for consult: Persistent hypoxia requiring BiPAP  CHIEF COMPLAINT:  Difficulty breathing  HISTORY OF PRESENT ILLNESS:   This is a 60 year old African-American male with a past medical history of cardiomyopathy, hypertension, obstructive sleep apnea, prostate cancer and CVA who presented with acute dyspnea, diaphoresis and palpitations. Patient states that symptoms started abruptly on 04/13/2016 and progressively got worse. He reports that it all started with a productive cough with clear sputum and then he started feeling his heart racing. He states that he felt like his heart will race and then stop. He denies associated chest pain, PND, nausea, vomiting and diarrhea. He denies having similar symptoms in the past. He reports taking all his medications as prescribed. Stat echo showed an EF of 15%. At the ED, his EKG showed ventricular tachycardia. He was started on amiodarone drip and was seen by cardiology. Heart rate has improved but he still reports dyspnea with minimal exertion.   SUBJECTIVE: No acute issues overnight. Maintained on BiPAP and  amiodarone infusion. Reports significant improvement in symptoms. Heart rate controlled   VITAL SIGNS: BP (!) 143/98 (BP Location: Right Arm)   Pulse 80   Temp 97.5 F (36.4 C) (Oral)   Resp (!) 22   Ht 6\' 1"  (1.854 m)   Wt 206 lb 5.6 oz (93.6 kg)   SpO2 97%   BMI 27.22 kg/m   HEMODYNAMICS:    VENTILATOR SETTINGS: FiO2 (%):  [35 %-50 %] 35 %  INTAKE / OUTPUT: I/O last 3 completed shifts: In: 1618.9 [I.V.:1618.9] Out: 1500 [Urine:1500]  PHYSICAL EXAMINATION: General: Well-nourished, well-developed, no distress Neuro: Alert and oriented 4, no focal deficits HEENT: Normocephalic and atraumatic, PERRLA, oral mucosa dry, trachea midline Cardiovascular: Rate and rhythm  regular, S1, S2 audible, no murmur, regurg or gallop Lungs: Mild increase in work of breathing, lungs are clear to auscultation bilaterally, mildly diminished in the bases Abdomen:  Soft, nontender, nondistended, normal bowel sounds Musculoskeletal: No visible deformities, positive range of motion in upper and lower extremities Extremities: +2 pulses, trace edema Skin: Warm and dry  LABS:  BMET  Recent Labs Lab 04/14/16 1043 04/15/16 0610  NA 137 138  K 4.1 4.3  CL 105 101  CO2 20* 25  BUN 17 23*  CREATININE 1.35* 1.27*  GLUCOSE 144* 104*    Electrolytes  Recent Labs Lab 04/14/16 1043 04/14/16 1617 04/15/16 0610  CALCIUM 9.3  --  9.0  MG 1.6* 1.7 1.6*  PHOS  --  4.5 4.1    CBC  Recent Labs Lab 04/14/16 1043 04/15/16 0516  WBC 11.5* 8.3  HGB 17.5 17.7  HCT 53.7* 53.5*  PLT 196 186    Coag's  Recent Labs Lab 04/14/16 1043  INR 1.19    Sepsis Markers No results for input(s): LATICACIDVEN, PROCALCITON, O2SATVEN in the last 168 hours.  ABG No results for input(s): PHART, PCO2ART, PO2ART in the last 168 hours.  Liver Enzymes  Recent Labs Lab 04/14/16 1043 04/15/16 0610  AST 21 60*  ALT 27 77*  ALKPHOS 55 50  BILITOT 2.0* 1.4*  ALBUMIN 4.1 4.0    Cardiac Enzymes  Recent Labs Lab 04/14/16 1617 04/14/16 2303 04/15/16 0610  TROPONINI <0.03 0.03* 0.04*    Glucose  Recent Labs Lab 04/14/16 1401 04/14/16 1501  GLUCAP 147* 166*    Imaging Portable Chest 1 View  Result Date: 04/15/2016 CLINICAL DATA:  60 year old male with pulmonary edema suspected on chest radiographs performed for altered mental status and tachycardia. Initial encounter. EXAM: PORTABLE CHEST 1 VIEW COMPARISON:  04/14/2016 and earlier. FINDINGS: Portable AP upright view at 0532 hours. Stable cardiomegaly and mediastinal contours. Regressed pulmonary vascularity. Now allowing for portable technique the lungs are clear. No pneumothorax. No pleural effusion identified.  Calcified aortic atherosclerosis. IMPRESSION: Resolved vascular congestion/interstitial edema. No acute cardiopulmonary abnormality. Cardiomegaly and Calcified aortic atherosclerosis. Electronically Signed   By: Genevie Ann M.D.   On: 04/15/2016 07:44   Dg Chest Portable 1 View  Result Date: 04/14/2016 CLINICAL DATA:  Patient with mental status change. Increased heart rate. EXAM: PORTABLE CHEST 1 VIEW COMPARISON:  Chest radiograph 04/14/2016. FINDINGS: Pacer apparatus overlies the left hemi thorax, stable cardiomegaly. Mild bilateral perihilar interstitial opacities. No pleural effusion or pneumothorax. Regional skeleton is unremarkable. IMPRESSION: Cardiomegaly.  Mild interstitial pulmonary edema. Electronically Signed   By: Lovey Newcomer M.D.   On: 04/14/2016 14:24   Dg Chest Portable 1 View  Result Date: 04/14/2016 CLINICAL DATA:  Chest pain and ventricular tachycardia. EXAM: PORTABLE CHEST 1 VIEW COMPARISON:  09/17/2008 chest radiograph FINDINGS: Upper limits normal heart size noted. Pulmonary vascular congestion is present. Defibrillator pads overlying the left chest identified. There is no evidence of focal airspace disease, pulmonary edema, suspicious pulmonary nodule/mass, pleural effusion, or pneumothorax. No acute bony abnormalities are identified. IMPRESSION: Upper limits normal heart size with pulmonary vascular congestion. Electronically Signed   By: Margarette Canada M.D.   On: 04/14/2016 11:11     STUDIES:  2-D echo: Left ventricular ejection fraction 15%, severely dilated left ventricle  CULTURES: None  ANTIBIOTICS: None  SIGNIFICANT EVENTS: 04/14/2016: ED with acute dyspnea and palpitations, admitted with sinus tachycardia and acute CHF  LINES/TUBES: Peripheral IVs  DISCUSSION: 60 year old African-American male with a history of prostate cancer, obstructive sleep apnea, hypertension and cardiomyopathy presenting with acute CHF exacerbation, wide-complex tach. Tachycardia and  uncontrolled hypertension  ASSESSMENT / PLAN:  PULMONARY A: Acute respiratory distress (hypoxic) secondary to pulmonary edema History of obstructive sleep apnea on home BiPAP P:   Weaned of bipap chest x-ray PRN IV diuresis per cardiology  CARDIOVASCULAR A:  Acute CHF exacerbation-echo very low EF. Uncontrolled hypertension Wide-complex tachycardia Dilated cardiomyopathy P:  Amiodarone infusion. Per cardiology. Currently weaning EKG when necessary IV diuresis Monitor hemodynamics per ICU protocol Beta blocker take cardiology Optimize blood pressure control with hydralazine and nitroglycerin glycerin ointment  RENAL A:   Acute on chronic kidney insufficiency (CKD II). She baseline creatinine 1.4, creatinine down to 1.2 this morning Hypomagnesemia P:   Monitor and replace electrolytes Trend creatinine Renally dose medications  GASTROINTESTINAL A:   No acute issues P:   Nothing by mouth when on BiPAP Pepcid for GI prophylaxis  HEMATOLOGIC A:   History of prostate cancer P:  Monitor hemoglobin and hematocrit Follow-up with oncology  NEUROLOGIC A:   History of CVA P:   RASS goal:N/A Monitor neurological status Continue statin and antihypertensives   Disposition and family update: No family at bedside. Patient updated on current treatment plan  [facility]dal[facility] ANP-BC Pulmonary and [place] Pager [phone] or [phone] 04/15/2016, 8:30 AM  STAFF NOTE: I, Dr. Vilinda Boehringer have personally reviewed patient's available data, including medical history, events of note, physical examination and test results as part of my evaluation. I have discussed with NP Patria Mane  and other care providers such as pharmacist, RN and  RRT.     A:59 yo with CHF and respiratory distress, now respiratory distress resolved   P:  - wean off amiodarone gtt, as tolerated - cont with supplemental o2 - diuresis per cards - stable to transfer  to monitor bed  .  Rest per NP/medical resident whose note is outlined above and that I agree with  Thank you for consulting [facility], we will signoff at this time.  Please feel free to contact us with any questions at [phone] (please enter 7-digits).   Critical Care Time devoted to patient care services described in this note is  35 Minutes.   This time reflects time of care of this signee Dr Vilinda Boehringer.  This critical care time does not reflect procedure time, or teaching time or supervisory time of PA/NP/Med-student/Med Resident etc but could involve care discussion time.  Vilinda Boehringer, MD [facility] Pager [phone] (please enter 7-digits) On Call Pager [phone] (please enter 7-digits)  Note: This note was prepared with Dragon dictation along with smaller phrase technology. Any transcriptional errors that result from this process are unintentional.

## 2016-04-15 NOTE — Progress Notes (Signed)
Patient to be transferred out of CCU to room 248 shortly. Report from RN. Informed this RN of Mag replacement orders different from what Dr. Benjie Karvonen said verbally. Called Dr. Benjie Karvonen and she said order should read Mg 400mg  BID instead of 800mg . Will update orders.

## 2016-04-15 NOTE — Progress Notes (Signed)
CCMD calling RN regarding patient continuing to have runs of VT, longest run being about 30 beats, most averaging closer to 10 beats. Patient does have history of that this admission. Amio gtt infusing. BP stable. Dr. Saralyn Pilar notified. Order to increase coreg dose to 6.25mg  BID PO and continue to monitor. No need to page MD as long as patient is asymptomatic. Will continue to monitor. CCMD updated that MD is aware, but they still will need to notify RN so we can make sure pt is not symptomatic during VT.

## 2016-04-16 ENCOUNTER — Encounter
Admission: EM | Disposition: A | Discharge status: Home / Self Care | Payer: Self-pay | Source: Home / Self Care | Attending: Internal Medicine

## 2016-04-16 ENCOUNTER — Encounter: Payer: Self-pay | Admitting: Cardiology

## 2016-04-16 DIAGNOSIS — I429 Cardiomyopathy, unspecified: Secondary | ICD-10-CM

## 2016-04-16 DIAGNOSIS — I209 Angina pectoris, unspecified: Secondary | ICD-10-CM

## 2016-04-16 HISTORY — PX: CARDIAC CATHETERIZATION: SHX172

## 2016-04-16 LAB — BASIC METABOLIC PANEL
Anion gap: 9 (ref 5–15)
BUN: 35 mg/dL — ABNORMAL HIGH (ref 6–20)
CHLORIDE: 102 mmol/L (ref 101–111)
CO2: 26 mmol/L (ref 22–32)
CREATININE: 1.69 mg/dL — AB (ref 0.61–1.24)
Calcium: 8.9 mg/dL (ref 8.9–10.3)
GFR, EST AFRICAN AMERICAN: 49 mL/min — AB (ref >60)
GFR, EST NON AFRICAN AMERICAN: 42 mL/min — AB (ref >60)
Glucose, Bld: 125 mg/dL — ABNORMAL HIGH (ref 65–99)
POTASSIUM: 3.8 mmol/L (ref 3.5–5.1)
SODIUM: 137 mmol/L (ref 135–145)

## 2016-04-16 LAB — CBC
HCT: 52.1 % — ABNORMAL HIGH (ref 40.0–52.0)
Hemoglobin: 17.1 g/dL (ref 13.0–18.0)
MCH: 26.3 pg (ref 26.0–34.0)
MCHC: 32.9 g/dL (ref 32.0–36.0)
MCV: 80.1 fL (ref 80.0–100.0)
Platelets: 179 10*3/uL (ref 150–440)
RBC: 6.5 MIL/uL — AB (ref 4.40–5.90)
RDW: 16.2 % — ABNORMAL HIGH (ref 11.5–14.5)
WBC: 7.4 10*3/uL (ref 3.8–10.6)

## 2016-04-16 LAB — PHOSPHORUS: PHOSPHORUS: 3.8 mg/dL (ref 2.5–4.6)

## 2016-04-16 LAB — MAGNESIUM: MAGNESIUM: 2 mg/dL (ref 1.7–2.4)

## 2016-04-16 LAB — DIGOXIN LEVEL: Digoxin Level: 0.2 ng/mL — ABNORMAL LOW (ref 0.8–2.0)

## 2016-04-16 SURGERY — RIGHT/LEFT HEART CATH AND CORONARY ANGIOGRAPHY
Anesthesia: Moderate Sedation

## 2016-04-16 MED ORDER — IOPAMIDOL (ISOVUE-300) INJECTION 61%
INTRAVENOUS | Status: DC | PRN
  Administered 2016-04-16: 125 mL via INTRAVENOUS

## 2016-04-16 MED ORDER — AMIODARONE HCL 200 MG PO TABS
400.0000 mg | ORAL_TABLET | Freq: Two times a day (BID) | ORAL | Status: DC
  Administered 2016-04-16 – 2016-04-17 (×4): 400 mg via ORAL
  Filled 2016-04-16 (×4): qty 2

## 2016-04-16 MED ORDER — FENTANYL CITRATE (PF) 100 MCG/2ML IJ SOLN
INTRAMUSCULAR | Status: AC
  Filled 2016-04-16: qty 2

## 2016-04-16 MED ORDER — SODIUM CHLORIDE 0.9 % IV SOLN: 250.0000 mL | INTRAVENOUS | Status: DC | PRN

## 2016-04-16 MED ORDER — FENTANYL CITRATE (PF) 100 MCG/2ML IJ SOLN
INTRAMUSCULAR | Status: DC | PRN
  Administered 2016-04-16: 25 ug via INTRAVENOUS

## 2016-04-16 MED ORDER — MIDAZOLAM HCL 2 MG/2ML IJ SOLN
INTRAMUSCULAR | Status: DC | PRN
  Administered 2016-04-16: 1 mg via INTRAVENOUS

## 2016-04-16 MED ORDER — SODIUM CHLORIDE 0.9% FLUSH
3.0000 mL | Freq: Two times a day (BID) | INTRAVENOUS | Status: DC
  Administered 2016-04-16: 3 mL via INTRAVENOUS

## 2016-04-16 MED ORDER — SODIUM CHLORIDE 0.9% FLUSH: 3.0000 mL | INTRAVENOUS | Status: DC | PRN

## 2016-04-16 MED ORDER — HEPARIN (PORCINE) IN NACL 2-0.9 UNIT/ML-% IJ SOLN
INTRAMUSCULAR | Status: AC
  Filled 2016-04-16: qty 500

## 2016-04-16 MED ORDER — SODIUM CHLORIDE 0.9 % WEIGHT BASED INFUSION: 1.0000 mL/kg/h | INTRAVENOUS | Status: DC

## 2016-04-16 MED ORDER — MIDAZOLAM HCL 2 MG/2ML IJ SOLN
INTRAMUSCULAR | Status: AC
  Filled 2016-04-16: qty 2

## 2016-04-16 MED ORDER — ASPIRIN 81 MG PO CHEW: 81.0000 mg | CHEWABLE_TABLET | ORAL | Status: DC

## 2016-04-16 MED ORDER — SODIUM CHLORIDE 0.9 % WEIGHT BASED INFUSION: 3.0000 mL/kg/h | INTRAVENOUS | Status: DC

## 2016-04-16 SURGICAL SUPPLY — 12 items
CATH 5FR JL4 DIAGNOSTIC (CATHETERS) ×2 IMPLANT
CATH 5FR PIGTAIL DIAGNOSTIC (CATHETERS) ×3 IMPLANT
CATH INFINITI JR4 5F (CATHETERS) ×3 IMPLANT
CATH SWANZ 7F THERMO (CATHETERS) ×3 IMPLANT
DEVICE CLOSURE MYNXGRIP 5F (Vascular Products) ×3 IMPLANT
KIT MANI 3VAL PERCEP (MISCELLANEOUS) ×3 IMPLANT
KIT RIGHT HEART (MISCELLANEOUS) ×3 IMPLANT
NEEDLE PERC 18GX7CM (NEEDLE) ×3 IMPLANT
PACK CARDIAC CATH (CUSTOM PROCEDURE TRAY) ×3 IMPLANT
SHEATH AVANTI 5FR X 11CM (SHEATH) ×3 IMPLANT
SHEATH PINNACLE 7F 10CM (SHEATH) ×3 IMPLANT
WIRE EMERALD 3MM-J .035X150CM (WIRE) ×3 IMPLANT

## 2016-04-16 NOTE — Progress Notes (Signed)
MD notified. Pt has one 5 beat run v-tach followed by 1 9 beats of v-tach. Amiodarone gtt discontinued, 400mg  BID PO ordered. Instructed to monitor patient. I will continue to assess.

## 2016-04-16 NOTE — Consult Note (Signed)
ELECTROPHYSIOLOGY CONSULT NOTE  Patient ID: Gary [name], MRN: [id number], DOB/AGE: 1956-01-27 60 y.o. Admit date: 04/14/2016 Date of Consult: 04/16/2016  Primary Physician: Lavera Guise, MD Primary Cardiologist: AP new  Chief Complaint:WCT   HPI Gary [name] is a 60 y.o. male  Seen for wide complex tachycardia.  He had presented 04/14/16 with wide-complex tachycardia occurring in nonsustained runs. They were of a left bundle inferior axis morphology with QRS in aVR/aVL. He was treated with amiodarone with some suppression. Troponins were negative; BNP was 3, 455.  Other laboratories are notable for polycythemia with hemoglobin of 17 Symptoms included diaphoresis tachypalps and sob  Hx of palps dating Back only days.  He has a history of a cardiomyopathy. He had been seen remotely by Dr. Billey Co, medications had been refilled he says by his primary care physician.  Modest chronic shortness of breath, no edema, no chest pain. No syncope.   Echo EF 9/17 15% catheterization no significant obstructive disease   Past Medical History:  Diagnosis Date  . Enlarged heart   . Hypertension   . Prostate cancer ([place])   . Sleep apnea   . Stroke [facility])       Surgical History:  Past Surgical History:  Procedure Laterality Date  . Prostate I-125 seed implant  10/12/14  . tumor removed from colon       Home Meds: Prior to Admission medications   Medication Sig Start Date End Date Taking? Authorizing Provider  amLODipine (NORVASC) 10 MG tablet Take 10 mg by mouth daily.   Yes Historical Provider, MD  aspirin 81 MG tablet Take 81 mg by mouth daily.   Yes Historical Provider, MD  atorvastatin (LIPITOR) 20 MG tablet Take 20 mg by mouth daily.   Yes Historical Provider, MD  carvedilol (COREG) 25 MG tablet Take 25 mg by mouth 2 (two) times daily with a meal.   Yes Historical Provider, MD  digoxin (LANOXIN) 0.125 MG tablet Take 0.125 mg by mouth every other day.   Yes Historical Provider,  MD  enalapril (VASOTEC) 10 MG tablet Take 10 mg by mouth 2 (two) times daily.   Yes Historical Provider, MD  NIFEdipine (PROCARDIA XL/ADALAT-CC) 60 MG 24 hr tablet Take 60 mg by mouth daily.   Yes Historical Provider, MD  spironolactone (ALDACTONE) 25 MG tablet Take 25 mg by mouth daily.   Yes Historical Provider, MD    Inpatient Medications:  . amiodarone  150 mg Intravenous Once  . aspirin EC  81 mg Oral Daily  . atorvastatin  20 mg Oral Daily  . budesonide (PULMICORT) nebulizer solution  0.25 mg Nebulization BID  . carvedilol  6.25 mg Oral BID WC  . digoxin  0.125 mg Oral QODAY  . docusate sodium  100 mg Oral BID  . enalapril  10 mg Oral BID  . enoxaparin (LOVENOX) injection  40 mg Subcutaneous Q24H  . famotidine  20 mg Oral BID  . furosemide  40 mg Intravenous Q12H  . ipratropium-albuterol  3 mL Nebulization Q6H  . nitroGLYCERIN  2 inch Topical Q6H  . potassium chloride  20 mEq Oral BID  . sodium chloride flush  3 mL Intravenous Q12H     Allergies: No Known Allergies  Social History   Social History  . Marital status: Single    Spouse name: N/A  . Number of children: N/A  . Years of education: N/A   Occupational History  . Not on file.   Social History Main  Topics  . Smoking status: Former Smoker    Types: Cigarettes    Quit date: 04/28/2014  . Smokeless tobacco: Never Used  . Alcohol use 0.6 oz/week    1 Cans of beer per week     Comment: occassionally  . Drug use: No  . Sexual activity: Not on file   Other Topics Concern  . Not on file   Social History Narrative  . No narrative on file     Family History  Problem Relation Age of Onset  . Kidney cancer Neg Hx   . Prostate cancer Neg Hx      ROS:  Please see the history of present illness.     All other systems reviewed and negative.    Physical Exam: Blood pressure 110/76, pulse 77, temperature 97.8 F (36.6 C), temperature source Oral, resp. rate 18, height 6\' 1"  (1.854 m), weight 200 lb (90.7  kg), SpO2 98 %. General: Well developed, well nourished male in no acute distress. Head: Normocephalic, atraumatic, sclera non-icteric, no xanthomas, nares are without discharge. EENT: normal Lymph Nodes:  none Back: without scoliosis/kyphosis, no CVA tendersness8-*10   Lungs: Clear bilaterally to auscultation without wheezes, rales, or rhonchi. Breathing is unlabored. Heart: RRR with S1 S2. 2/6 systolic  murmur , rubs, or gallops appreciated. Abdomen: Soft, non-tender, non-distended with normoactive bowel sounds. No hepatomegaly. No rebound/guarding. No obvious abdominal masses. Msk:  Strength and tone appear normal for age. Extremities: No clubbing or cyanosis.tr  edema.  Distal pedal pulses are 2+ and equal bilaterally. Skin: Warm and Dry Neuro: Alert and oriented X 3. CN III-XII intact Grossly normal sensory and motor function . Psych:  Responds to questions appropriately with a normal affect.      Labs: Cardiac Enzymes  Recent Labs  04/14/16 1043 04/14/16 1617 04/14/16 2303 04/15/16 0610  TROPONINI 0.03* <0.03 0.03* 0.04*   CBC Lab Results  Component Value Date   WBC 7.4 04/16/2016   HGB 17.1 04/16/2016   HCT 52.1 (H) 04/16/2016   MCV 80.1 04/16/2016   PLT 179 04/16/2016   PROTIME:  Recent Labs  04/14/16 1043  LABPROT 15.2  INR 1.19   Chemistry  Recent Labs Lab 04/15/16 0610 04/16/16 0407  NA 138 137  K 4.3 3.8  CL 101 102  CO2 25 26  BUN 23* 35*  CREATININE 1.27* 1.69*  CALCIUM 9.0 8.9  PROT 7.4  --   BILITOT 1.4*  --   ALKPHOS 50  --   ALT 77*  --   AST 60*  --   GLUCOSE 104* 125*   Lipids Lab Results  Component Value Date   CHOL 144 05/04/2015   HDL 31 (L) 05/04/2015   LDLCALC 91 05/04/2015   TRIG 110 05/04/2015   BNP No results found for: PROBNP Thyroid Function Tests:  Recent Labs  04/15/16 0610  TSH 2.654      Miscellaneous No results found for: DDIMER  Radiology/Studies:  Portable Chest 1 View  Result Date:  04/15/2016 CLINICAL DATA:  60 year old male with pulmonary edema suspected on chest radiographs performed for altered mental status and tachycardia. Initial encounter. EXAM: PORTABLE CHEST 1 VIEW COMPARISON:  04/14/2016 and earlier. FINDINGS: Portable AP upright view at 0532 hours. Stable cardiomegaly and mediastinal contours. Regressed pulmonary vascularity. Now allowing for portable technique the lungs are clear. No pneumothorax. No pleural effusion identified. Calcified aortic atherosclerosis. IMPRESSION: Resolved vascular congestion/interstitial edema. No acute cardiopulmonary abnormality. Cardiomegaly and Calcified aortic atherosclerosis. Electronically Signed  By: Genevie Ann M.D.   On: 04/15/2016 07:44   Dg Chest Portable 1 View  Result Date: 04/14/2016 CLINICAL DATA:  Patient with mental status change. Increased heart rate. EXAM: PORTABLE CHEST 1 VIEW COMPARISON:  Chest radiograph 04/14/2016. FINDINGS: Pacer apparatus overlies the left hemi thorax, stable cardiomegaly. Mild bilateral perihilar interstitial opacities. No pleural effusion or pneumothorax. Regional skeleton is unremarkable. IMPRESSION: Cardiomegaly.  Mild interstitial pulmonary edema. Electronically Signed   By: Lovey Newcomer M.D.   On: 04/14/2016 14:24   Dg Chest Portable 1 View  Result Date: 04/14/2016 CLINICAL DATA:  Chest pain and ventricular tachycardia. EXAM: PORTABLE CHEST 1 VIEW COMPARISON:  09/17/2008 chest radiograph FINDINGS: Upper limits normal heart size noted. Pulmonary vascular congestion is present. Defibrillator pads overlying the left chest identified. There is no evidence of focal airspace disease, pulmonary edema, suspicious pulmonary nodule/mass, pleural effusion, or pneumothorax. No acute bony abnormalities are identified. IMPRESSION: Upper limits normal heart size with pulmonary vascular congestion. Electronically Signed   By: Margarette Canada M.D.   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    The patient has ventricular tachycardia  within the morphology consistent with origin in  the right ventricular outflow tract and a pattern of repetitive monomorphic VT.  This suggests the possibility that his cardiomyopathy is in fact arrhythmia related, i.e. secondary. In any case, suppression of his ventricular tachycardia is essential and agree with the use of amiodarone. Overnight it seems to have been significantly beneficial in reducing the overall burden of ventricular ectopy.  It is also possible that the repetitive monomorphic ventricular tachycardia is related to his underlying nonischemic cardiomyopathy. Guideline directed medical therapy is appropriate and reassessment of LVEF suppression of his ventricular ectopy appropriate prior to making a final decision regarding ICD therapy.   Decrease diuretics   Virl Axe

## 2016-04-16 NOTE — Progress Notes (Signed)
Initial Heart Failure Clinic appointment scheduled for May 09, 2016 at 11:30am. Thank you.

## 2016-04-16 NOTE — Care Management Important Message (Signed)
Important Message  Patient Details  Name: Gary [name] MRN: [id number] Date of Birth: July 28, 1956   Medicare Important Message Given:  Yes    Katrina Stack, RN 04/16/2016, 4:03 PM

## 2016-04-16 NOTE — Progress Notes (Signed)
[place] at [place] NAME: Gary [name]    MR#:  [id number]  DATE OF BIRTH:  04-13-1956  SUBJECTIVE:  Gary [name] for cardiac cath today No major blockages reprorted as per nursing No SOB or CP  Episode of vtach yesterday at the time of transfer from CCU  REVIEW OF SYSTEMS:    Review of Systems  Constitutional: Negative.  Negative for chills, fever and malaise/fatigue.  HENT: Negative.  Negative for ear discharge, ear pain, hearing loss, nosebleeds and sore throat.   Eyes: Negative.  Negative for blurred vision and pain.  Respiratory: Negative.  Negative for cough, hemoptysis, shortness of breath and wheezing.   Cardiovascular: Negative.  Negative for chest pain, palpitations and leg swelling.  Gastrointestinal: Negative.  Negative for abdominal pain, blood in stool, diarrhea, nausea and vomiting.  Genitourinary: Negative.  Negative for dysuria.  Musculoskeletal: Negative.  Negative for back pain.  Skin: Negative.   Neurological: Negative for dizziness, tremors, speech change, focal weakness, seizures and headaches.  Endo/Heme/Allergies: Negative.  Does not bruise/bleed easily.  Psychiatric/Behavioral: Negative.  Negative for depression, hallucinations and suicidal ideas.    Tolerating Diet:yes      DRUG ALLERGIES:  No Known Allergies  VITALS:  Blood pressure (!) 151/97, pulse 78, temperature 97.8 F (36.6 C), temperature source Oral, resp. rate 18, height 6\' 1"  (1.854 m), weight 90.7 kg (200 lb), SpO2 99 %.  PHYSICAL EXAMINATION:   Physical Exam  Constitutional: He is oriented to person, place, and time and well-developed, well-nourished, and in no distress. No distress.  HENT:  Head: Normocephalic.  Eyes: No scleral icterus.  Neck: Normal range of motion. Neck supple. No JVD present. No tracheal deviation present.  Cardiovascular: Normal rate, regular rhythm and normal heart sounds.  Exam reveals no gallop and no friction rub.    No murmur heard. Pulmonary/Chest: Effort normal and breath sounds normal. No respiratory distress. He has no wheezes. He has no rales. He exhibits no tenderness.  Abdominal: Soft. Bowel sounds are normal. He exhibits no distension and no mass. There is no tenderness. There is no rebound and no guarding.  Musculoskeletal: Normal range of motion. He exhibits no edema.  Neurological: He is alert and oriented to person, place, and time.  Skin: Skin is warm. No rash noted. No erythema.  Psychiatric: Affect and judgment normal.      LABORATORY PANEL:   CBC  Recent Labs Lab 04/16/16 0407  WBC 7.4  HGB 17.1  HCT 52.1*  PLT 179   ------------------------------------------------------------------------------------------------------------------  Chemistries   Recent Labs Lab 04/15/16 0610 04/16/16 0407  NA 138 137  K 4.3 3.8  CL 101 102  CO2 25 26  GLUCOSE 104* 125*  BUN 23* 35*  CREATININE 1.27* 1.69*  CALCIUM 9.0 8.9  MG 1.6* 2.0  AST 60*  --   ALT 77*  --   ALKPHOS 50  --   BILITOT 1.4*  --    ------------------------------------------------------------------------------------------------------------------  Cardiac Enzymes  Recent Labs Lab 04/14/16 1617 04/14/16 2303 04/15/16 0610  TROPONINI <0.03 0.03* 0.04*   ------------------------------------------------------------------------------------------------------------------  RADIOLOGY:  Portable Chest 1 View  Result Date: 04/15/2016 CLINICAL DATA:  60 year old male with pulmonary edema suspected on chest radiographs performed for altered mental status and tachycardia. Initial encounter. EXAM: PORTABLE CHEST 1 VIEW COMPARISON:  04/14/2016 and earlier. FINDINGS: Portable AP upright view at 0532 hours. Stable cardiomegaly and mediastinal contours. Regressed pulmonary vascularity. Now allowing for portable technique the lungs are clear. No  pneumothorax. No pleural effusion identified. Calcified aortic  atherosclerosis. IMPRESSION: Resolved vascular congestion/interstitial edema. No acute cardiopulmonary abnormality. Cardiomegaly and Calcified aortic atherosclerosis. Electronically Signed   By: Genevie Ann M.D.   On: 04/15/2016 07:44   Dg Chest Portable 1 View  Result Date: 04/14/2016 CLINICAL DATA:  Patient with mental status change. Increased heart rate. EXAM: PORTABLE CHEST 1 VIEW COMPARISON:  Chest radiograph 04/14/2016. FINDINGS: Pacer apparatus overlies the left hemi thorax, stable cardiomegaly. Mild bilateral perihilar interstitial opacities. No pleural effusion or pneumothorax. Regional skeleton is unremarkable. IMPRESSION: Cardiomegaly.  Mild interstitial pulmonary edema. Electronically Signed   By: Lovey Newcomer M.D.   On: 04/14/2016 14:24     ASSESSMENT AND PLAN:    60 year old male with a history of cardiomyopathy EF 15%and essential hypertension who presents with shortness of breath with palpitations and diaphoresis and found to have wide complex tachycardia.  1. Intermittent wide complex tachycardia/V. tach initially at a rate of 200 bp with history of cardiomyopathy: Patient on amiodarone drip.  Continue Coreg and digoxin. Appreciate cardiology evaluation F/u on final results for cardiac catheterization Dr Caryl Comes to evaluate for AICD. Keep magnesium level at 2 potassium Continue telemetry monitor.  2. Acute on chronic systolic heart failure with ejection fraction of15%:  Continue Lasix, Coreg,enalapril  3. Accelerated on essential hypertension: Continue enalapril and Coreg.   4. Hypo-magnesium: Continue magnesium supplementation.  Management plans discussed with the patient and he is in agreement.   Rounded with nursing staff  CODE STATUS: full  Critical careTOTAL TIME TAKING CARE OF THIS PATIENT: 25 minutes.    POSSIBLE D/C 2-3 days, DEPENDING ON CLINICAL CONDITION.   Nazia Rhines M.D on 04/16/2016 at 12:35 PM  Between 7am to 6pm - Pager - [phone] After 6pm  go to [email or web address] - password [email or web address] [facility]  Office  [phone]  CC: Primary care physician; Lavera Guise, MD  Note: This dictation was prepared with Dragon dictation along with smaller phrase technology. Any transcriptional errors that result from this process are unintentional.

## 2016-04-16 NOTE — Care Management (Signed)
Patient transferred to 2A from icu stepdown with in last 24 hours.  Admitted with Vtach and placed on amiodarone drip in icu. Has had some episodes of vtach since transfer to 2A.   Cath today did not reveal any blockages.  To be evaluated for AICD.  Patient has EF of 15%

## 2016-04-17 MED ORDER — IPRATROPIUM-ALBUTEROL 0.5-2.5 (3) MG/3ML IN SOLN
3.0000 mL | Freq: Two times a day (BID) | RESPIRATORY_TRACT | Status: DC
  Administered 2016-04-18 – 2016-04-19 (×3): 3 mL via RESPIRATORY_TRACT
  Filled 2016-04-17 (×3): qty 3

## 2016-04-17 NOTE — Progress Notes (Addendum)
[place] at [place] NAME: Gary [name]    MR#:  [id number]  DATE OF BIRTH:  07/21/1956  SUBJECTIVE:  Wants to go home, Based on cardiology note from 18th of September.  Plan is for AICD tomorrow REVIEW OF SYSTEMS:    Review of Systems  Constitutional: Negative.  Negative for chills, fever and malaise/fatigue.  HENT: Negative.  Negative for ear discharge, ear pain, hearing loss, nosebleeds and sore throat.   Eyes: Negative.  Negative for blurred vision and pain.  Respiratory: Negative.  Negative for cough, hemoptysis, shortness of breath and wheezing.   Cardiovascular: Negative.  Negative for chest pain, palpitations and leg swelling.  Gastrointestinal: Negative.  Negative for abdominal pain, blood in stool, diarrhea, nausea and vomiting.  Genitourinary: Negative.  Negative for dysuria.  Musculoskeletal: Negative.  Negative for back pain.  Skin: Negative.   Neurological: Negative for dizziness, tremors, speech change, focal weakness, seizures and headaches.  Endo/Heme/Allergies: Negative.  Does not bruise/bleed easily.  Psychiatric/Behavioral: Negative.  Negative for depression, hallucinations and suicidal ideas.   Tolerating Diet:yes DRUG ALLERGIES:  No Known Allergies VITALS:  Blood pressure 119/86, pulse 80, temperature 97.4 F (36.3 C), temperature source Oral, resp. rate 18, height 6\' 1"  (1.854 m), weight 90.6 kg (199 lb 12.8 oz), SpO2 95 %. PHYSICAL EXAMINATION:   Physical Exam  Constitutional: He is oriented to person, place, and time and well-developed, well-nourished, and in no distress. No distress.  HENT:  Head: Normocephalic.  Eyes: No scleral icterus.  Neck: Normal range of motion. Neck supple. No JVD present. No tracheal deviation present.  Cardiovascular: Normal rate, regular rhythm and normal heart sounds.  Exam reveals no gallop and no friction rub.   No murmur heard. Pulmonary/Chest: Effort normal and breath sounds  normal. No respiratory distress. He has no wheezes. He has no rales. He exhibits no tenderness.  Abdominal: Soft. Bowel sounds are normal. He exhibits no distension and no mass. There is no tenderness. There is no rebound and no guarding.  Musculoskeletal: Normal range of motion. He exhibits no edema.  Neurological: He is alert and oriented to person, place, and time.  Skin: Skin is warm. No rash noted. No erythema.  Psychiatric: Affect and judgment normal.   LABORATORY PANEL:   CBC  Recent Labs Lab 04/16/16 0407  WBC 7.4  HGB 17.1  HCT 52.1*  PLT 179   ------------------------------------------------------------------------------------------------------------------  Chemistries   Recent Labs Lab 04/15/16 0610 04/16/16 0407  NA 138 137  K 4.3 3.8  CL 101 102  CO2 25 26  GLUCOSE 104* 125*  BUN 23* 35*  CREATININE 1.27* 1.69*  CALCIUM 9.0 8.9  MG 1.6* 2.0  AST 60*  --   ALT 77*  --   ALKPHOS 50  --   BILITOT 1.4*  --     ASSESSMENT AND PLAN:  60 year old male with a history of cardiomyopathy EF 15% and essential hypertension who presents with shortness of breath with palpitations and diaphoresis and found to have wide complex tachycardia.  1. Intermittent wide complex tachycardia/V. tach initially at a rate of 200 bp with history of cardiomyopathy:  - on Amiodarone 400 mg po bid AND on d/c switch him to once daily (d/w Dr Saralyn Pilar) Continue Coreg and digoxin. Appreciate cardiology evaluation cardiac catheterization showed insignificant coronary disease recommends medical management Dr Caryl Comes to evaluate for AICD tomorrow Keep magnesium level at 2 potassium 4 Continue telemetry monitor.  2. Acute on chronic systolic heart  failure with ejection fraction of 15%:  Continue Lasix, Coreg,enalapril Dr Caryl Comes to evaluate for AICD tomorrow  3. Accelerated on essential hypertension: Continue enalapril and Coreg.  4. Hypo-magnesium: Continue magnesium  supplementation.  5. ARF: Monitor, Creat 1.27->1.69 - recheck labs in am  Management plans discussed with the patient, nursing and they are in agreement.   CODE STATUS: full  TOTAL TIME TAKING CARE OF THIS PATIENT: 25 minutes.    POSSIBLE D/C in am, DEPENDING ON CLINICAL CONDITION.  And cardiology evaluation.    [facility], Yazmen Briones M.D on 04/17/2016 at 4:45 PM  Between 7am to 6pm - Pager - [phone] After 6pm go to [email or web address] - password [email or web address] [facility]  Office  [phone]  CC: Primary care physician; Lavera Guise, MD  Note: This dictation was prepared with Dragon dictation along with smaller phrase technology. Any transcriptional errors that result from this process are unintentional.

## 2016-04-18 ENCOUNTER — Encounter: Payer: Self-pay | Admitting: Physician Assistant

## 2016-04-18 DIAGNOSIS — I428 Other cardiomyopathies: Secondary | ICD-10-CM

## 2016-04-18 DIAGNOSIS — I5023 Acute on chronic systolic (congestive) heart failure: Secondary | ICD-10-CM | POA: Diagnosis present

## 2016-04-18 DIAGNOSIS — I2510 Atherosclerotic heart disease of native coronary artery without angina pectoris: Secondary | ICD-10-CM

## 2016-04-18 DIAGNOSIS — N179 Acute kidney failure, unspecified: Secondary | ICD-10-CM

## 2016-04-18 LAB — BASIC METABOLIC PANEL
Anion gap: 7 (ref 5–15)
BUN: 40 mg/dL — AB (ref 6–20)
CALCIUM: 9 mg/dL (ref 8.9–10.3)
CO2: 25 mmol/L (ref 22–32)
CREATININE: 1.72 mg/dL — AB (ref 0.61–1.24)
Chloride: 105 mmol/L (ref 101–111)
GFR calc Af Amer: 48 mL/min — ABNORMAL LOW (ref >60)
GFR calc non Af Amer: 41 mL/min — ABNORMAL LOW (ref >60)
GLUCOSE: 105 mg/dL — AB (ref 65–99)
Potassium: 4.1 mmol/L (ref 3.5–5.1)
Sodium: 137 mmol/L (ref 135–145)

## 2016-04-18 LAB — CBC
HCT: 52.4 % — ABNORMAL HIGH (ref 40.0–52.0)
Hemoglobin: 17 g/dL (ref 13.0–18.0)
MCH: 26.3 pg (ref 26.0–34.0)
MCHC: 32.5 g/dL (ref 32.0–36.0)
MCV: 80.9 fL (ref 80.0–100.0)
Platelets: 195 10*3/uL (ref 150–440)
RBC: 6.47 MIL/uL — ABNORMAL HIGH (ref 4.40–5.90)
RDW: 15.9 % — ABNORMAL HIGH (ref 11.5–14.5)
WBC: 8.5 10*3/uL (ref 3.8–10.6)

## 2016-04-18 LAB — MAGNESIUM: Magnesium: 2 mg/dL (ref 1.7–2.4)

## 2016-04-18 MED ORDER — INFLUENZA VAC SPLIT QUAD 0.5 ML IM SUSY
0.5000 mL | PREFILLED_SYRINGE | INTRAMUSCULAR | Status: AC
  Administered 2016-04-19: 0.5 mL via INTRAMUSCULAR
  Filled 2016-04-18: qty 0.5

## 2016-04-18 MED ORDER — FUROSEMIDE 40 MG PO TABS
40.0000 mg | ORAL_TABLET | Freq: Every day | ORAL | Status: DC
  Administered 2016-04-18 – 2016-04-19 (×2): 40 mg via ORAL
  Filled 2016-04-18 (×2): qty 1

## 2016-04-18 MED ORDER — MAGNESIUM OXIDE 400 (241.3 MG) MG PO TABS
400.0000 mg | ORAL_TABLET | Freq: Two times a day (BID) | ORAL | Status: DC
  Administered 2016-04-18 – 2016-04-19 (×3): 400 mg via ORAL
  Filled 2016-04-18 (×3): qty 1

## 2016-04-18 MED ORDER — CARVEDILOL 12.5 MG PO TABS
12.5000 mg | ORAL_TABLET | Freq: Two times a day (BID) | ORAL | Status: DC
  Administered 2016-04-18 – 2016-04-19 (×2): 12.5 mg via ORAL
  Filled 2016-04-18 (×2): qty 1

## 2016-04-18 MED ORDER — AMIODARONE HCL 200 MG PO TABS
600.0000 mg | ORAL_TABLET | Freq: Two times a day (BID) | ORAL | Status: DC
  Administered 2016-04-18 – 2016-04-19 (×3): 600 mg via ORAL
  Filled 2016-04-18 (×3): qty 3

## 2016-04-18 NOTE — Progress Notes (Signed)
Patient had a episode of tachycardia with PVCs.  No symptoms noted.

## 2016-04-18 NOTE — Progress Notes (Signed)
[place] consulted to monitor amiodarone drug interactions in this 64 yoM who has a past medical history significant for HTN, CHF, and cardiomyopathy now with tachypalpitations and sweats. Pt was found to be in ventricular tachycardia and started on Amiodarone.  Assessment/Plan  1. Amiodarone drug interactions - patient was taking digoxin prior to admission but has not been restarted on this medication. Amiodarone could potentially increase the effects of carvedilol but pt is currently within normal HR. Amiodarone and atorvastatin may result in increased risk of myopathy, but no symptoms of myopathy or rhabdomyolysis. No other medication adjustments needed. Pharmacy will continue to monitor.  No Known Allergies  Patient Measurements: Height: 6\' 1"  (185.4 cm) Weight: 196 lb 9.6 oz (89.2 kg) IBW/kg (Calculated) : 79.9  Vital Signs: Temp: 97.5 F (36.4 C) (09/21 0755) Temp Source: Oral (09/21 0755) BP: 110/79 (09/21 0755) Pulse Rate: 80 (09/21 0755) Intake/Output from previous day: 09/20 0701 - 09/21 0700 In: 1560 [P.O.:360; I.V.:1200] Out: 1925 [Urine:1925] Intake/Output from this shift: Total I/O In: -  Out: 1000 [Urine:1000]  Labs:  Recent Labs  04/16/16 0407 04/18/16 0423  WBC 7.4 8.5  HGB 17.1 17.0  HCT 52.1* 52.4*  PLT 179 195     Recent Labs  04/16/16 0407 04/18/16 0423  NA 137 137  K 3.8 4.1  CL 102 105  CO2 26 25  GLUCOSE 125* 105*  BUN 35* 40*  CREATININE 1.69* 1.72*  CALCIUM 8.9 9.0  MG 2.0 2.0  PHOS 3.8  --    Estimated Creatinine Clearance: 51.6 mL/min (by C-G formula based on SCr of 1.72 mg/dL (H)).   No results for input(s): GLUCAP in the last 72 hours.  Medications:  Scheduled:  . amiodarone  600 mg Oral BID  . aspirin EC  81 mg Oral Daily  . atorvastatin  20 mg Oral Daily  . budesonide (PULMICORT) nebulizer solution  0.25 mg Nebulization BID  . carvedilol  12.5 mg Oral BID WC  . docusate sodium  100 mg Oral BID  .  enoxaparin (LOVENOX) injection  40 mg Subcutaneous Q24H  . furosemide  40 mg Oral Daily  . ipratropium-albuterol  3 mL Nebulization BID  . magnesium oxide  400 mg Oral BID  . potassium chloride  20 mEq Oral BID  . sodium chloride flush  3 mL Intravenous Q12H   Infusions:  . sodium chloride 50 mL/hr at 04/18/16 0526     Carmin Richmond, PharmD Pharmacy Resident 04/18/2016 9:43 AM

## 2016-04-18 NOTE — Progress Notes (Signed)
MEDICATION RELATED CONSULT NOTE - INITIAL   Pharmacy Consult for electrolyte monitoring/management Indication: Keep K >/=4 and Mg >/=2  No Known Allergies  Patient Measurements: Height: 6\' 1"  (185.4 cm) Weight: 196 lb 9.6 oz (89.2 kg) IBW/kg (Calculated) : 79.9  Vital Signs: Temp: 97.5 F (36.4 C) (09/21 1148) Temp Source: Oral (09/21 1148) BP: 130/91 (09/21 1148) Pulse Rate: 83 (09/21 1148) Intake/Output from previous day: 09/20 0701 - 09/21 0700 In: 1560 [P.O.:360; I.V.:1200] Out: 1925 [Urine:1925] Intake/Output from this shift: Total I/O In: -  Out: 1250 [Urine:1250]  Labs:  Recent Labs  04/16/16 0407 04/18/16 0423  WBC 7.4 8.5  HGB 17.1 17.0  HCT 52.1* 52.4*  PLT 179 195  CREATININE 1.69* 1.72*  MG 2.0 2.0  PHOS 3.8  --    Estimated Creatinine Clearance: 51.6 mL/min (by C-G formula based on SCr of 1.72 mg/dL (H)).   Microbiology: Recent Results (from the past 720 hour(s))  MRSA PCR Screening     Status: None   Collection Time: 04/14/16  3:00 PM  Result Value Ref Range Status   MRSA by PCR NEGATIVE NEGATIVE Final    Comment:        The GeneXpert MRSA Assay (FDA approved for NASAL specimens only), is one component of a comprehensive MRSA colonization surveillance program. It is not intended to diagnose MRSA infection nor to guide or monitor treatment for MRSA infections.     Medical History: Past Medical History:  Diagnosis Date  . Chronic systolic CHF (congestive heart failure) (HCC)    a. see echo   . Coronary artery disease, non-occlusive    a. cardiac cath 04/16/16: mLAD 30%, p-mRCA 20%, OM2 30% x 2, RPDA 60%, dilated cardiomyopathy w/ diffuse HK, nl right pressures  . Enlarged heart   . Frequent PVCs   . Hypertension   . NICM (nonischemic cardiomyopathy) ([place])    a. echo 04/14/16: EF 15%, mild concentric LVH, mild AI  . Prostate cancer ([place])   . Sleep apnea   . Stroke ([place])   . VT (ventricular tachycardia) ([place]) 04/14/2016   a.  monomorphic in LBBB pattern    Medications:  Scheduled:  . amiodarone  600 mg Oral BID  . aspirin EC  81 mg Oral Daily  . atorvastatin  20 mg Oral Daily  . budesonide (PULMICORT) nebulizer solution  0.25 mg Nebulization BID  . carvedilol  12.5 mg Oral BID WC  . docusate sodium  100 mg Oral BID  . enoxaparin (LOVENOX) injection  40 mg Subcutaneous Q24H  . furosemide  40 mg Oral Daily  . [START ON 04/19/2016] Influenza vac split quadrivalent PF  0.5 mL Intramuscular Tomorrow-1000  . ipratropium-albuterol  3 mL Nebulization BID  . magnesium oxide  400 mg Oral BID  . potassium chloride  20 mEq Oral BID  . sodium chloride flush  3 mL Intravenous Q12H    Assessment: Pharmacy consulted to assist with monitoring electrolytes in this 60 year old male. Per MD, would like to keep K ~4 and Mg ~2.   K= 4.1 Mg = 2.0  Goal of Therapy:  Mg ~2, K ~4  Plan:  No supplementation needed at this time.  Continue current orders for mag-ox 400 mg PO BID and KCl 20 mEq PO BID.  Will check electrolytes with AM labs tomorrow.  Lenis Noon, PharmD, BCPS Clinical Pharmacist 04/18/2016,2:28 PM

## 2016-04-18 NOTE — Progress Notes (Signed)
Gary [name] at [place] NAME: Gary [name]    MR#:  [id number]  DATE OF BIRTH:  Dec 06, 1955  SUBJECTIVE:  No new symptoms, willing to stay here longer for further cardiology workup, creatinine worsening REVIEW OF SYSTEMS:    Review of Systems  Constitutional: Negative.  Negative for chills, fever and malaise/fatigue.  HENT: Negative.  Negative for ear discharge, ear pain, hearing loss, nosebleeds and sore throat.   Eyes: Negative.  Negative for blurred vision and pain.  Respiratory: Negative.  Negative for cough, hemoptysis, shortness of breath and wheezing.   Cardiovascular: Negative.  Negative for chest pain, palpitations and leg swelling.  Gastrointestinal: Negative.  Negative for abdominal pain, blood in stool, diarrhea, nausea and vomiting.  Genitourinary: Negative.  Negative for dysuria.  Musculoskeletal: Negative.  Negative for back pain.  Skin: Negative.   Neurological: Negative for dizziness, tremors, speech change, focal weakness, seizures and headaches.  Endo/Heme/Allergies: Negative.  Does not bruise/bleed easily.  Psychiatric/Behavioral: Negative.  Negative for depression, hallucinations and suicidal ideas.   Tolerating Diet:yes DRUG ALLERGIES:  No Known Allergies VITALS:  Blood pressure (!) 130/91, pulse 83, temperature 97.5 F (36.4 C), temperature source Oral, resp. rate 18, height 6\' 1"  (1.854 m), weight 89.2 kg (196 lb 9.6 oz), SpO2 95 %. PHYSICAL EXAMINATION:   Physical Exam  Constitutional: He is oriented to person, place, and time and well-developed, well-nourished, and in no distress. No distress.  HENT:  Head: Normocephalic.  Eyes: No scleral icterus.  Neck: Normal range of motion. Neck supple. No JVD present. No tracheal deviation present.  Cardiovascular: Normal rate, regular rhythm and normal heart sounds.  Exam reveals no gallop and no friction rub.   No murmur heard. Pulmonary/Chest: Effort normal and breath  sounds normal. No respiratory distress. He has no wheezes. He has no rales. He exhibits no tenderness.  Abdominal: Soft. Bowel sounds are normal. He exhibits no distension and no mass. There is no tenderness. There is no rebound and no guarding.  Musculoskeletal: Normal range of motion. He exhibits no edema.  Neurological: He is alert and oriented to person, place, and time.  Skin: Skin is warm. No rash noted. No erythema.  Psychiatric: Affect and judgment normal.   LABORATORY PANEL:   CBC  Recent Labs Lab 04/18/16 0423  WBC 8.5  HGB 17.0  HCT 52.4*  PLT 195   ------------------------------------------------------------------------------------------------------------------  Chemistries   Recent Labs Lab 04/15/16 0610  04/18/16 0423  NA 138  < > 137  K 4.3  < > 4.1  CL 101  < > 105  CO2 25  < > 25  GLUCOSE 104*  < > 105*  BUN 23*  < > 40*  CREATININE 1.27*  < > 1.72*  CALCIUM 9.0  < > 9.0  MG 1.6*  < > 2.0  AST 60*  --   --   ALT 77*  --   --   ALKPHOS 50  --   --   BILITOT 1.4*  --   --   < > = values in this interval not displayed.  ASSESSMENT AND PLAN:  60 year old male with a history of cardiomyopathy EF 15% and essential hypertension who presents with shortness of breath with palpitations and diaphoresis and found to have wide complex tachycardia.  1. Intermittent wide complex tachycardia/V. Tach - EF 15% by echo. Nonobstructive CAD by cardiac cath this admission. - d/w Thurmond Butts who has discussed with electrophysiology and may consider ICD implantation  if no improvement with medical management, would like to keep him in-house for another few days to decide final course -Increased Coreg to 12.5 mg bid as well as amiodarone to 600 mg bid  2. Acute on chronic systolic CHF/dilated cardiomyopathy/NICM: - Neg 1.8 L fluid balance while on Lasix - Continue Coreg and amiodarone -Not on ACEi/ARB due to worsening renal function - may require [place] to hold Lasix  3.  ARF: Monitor, Creat 1.27->1.69->1.72 - recheck labs in am - Nephrology consultation  4. mildly elevated troponin: -Due to supply demand ischemia -Continue ASA & Coreg  5. HTN: -Stable on Coreg and amiodarone  6. Hypomagnesemia: - Continue magnesium supplementation, Magnesium close to 2.0  7.  Hyperlipidemia - Continue Lipitor    Management plans discussed with the patient, nursing and they are in agreement.   CODE STATUS: full  TOTAL TIME TAKING CARE OF THIS PATIENT: 25 minutes.    POSSIBLE D/C early next week, DEPENDING ON CLINICAL CONDITION.  And cardiology evaluation.  Will require AICD while in-house   [facility], Safiya Girdler M.D on 04/18/2016 at 12:50 PM  Between 7am to 6pm - Pager - [phone] After 6pm go to [email or web address] - password [email or web address] [facility]  Office  [phone]  CC: Primary care physician; Lavera Guise, MD  Note: This dictation was prepared with Dragon dictation along with smaller phrase technology. Any transcriptional errors that result from this process are unintentional.

## 2016-04-18 NOTE — Consult Note (Signed)
Electrophysiology progress  Note  Patient ID: Gary [name], MRN: [id number], DOB/AGE: Apr 22, 1956 60 y.o. Admit date: 04/14/2016   Date of Consult: 04/18/2016 Primary Physician: Lavera Guise, MD Primary Cardiologist: Dr. Saralyn Pilar, MD Requesting Physician: Dr. Saralyn Pilar, MD Primary Electrophysiologist: Dr. Caryl Comes, MD  Chief Complaint: Palpitations Reason for Consult: WCT  HPI: 60 y.o. male with h/o nonobstructive CAD by cardiac cath on [id number], chronic systolic CHF with EF of 0000000 by echo 04/14/16, HTN, and newly found WCT c/w monomorphic sustained VT who EP is asked to evaluate for possible ICD. He was initially consulted on by [facility] and has been seen remotely by Dr. Clayborn Bigness, MD. He presented to [facility] on 04/14/16 with complaints of palpitations for several days prior to admission as well as diaphoresis. Never with syncope, chest pain, nausea, vomiting, dizziness, or presyncope. In the ED he was noted to have frequent PVCs followed by sustained WCT c/w monomorphic sustained VT at 185 bpm on 12-lead EKG. He was started on amiodarone gtt with subsequent improvement in ventricular ectopy. LHC showed nonobstructive disease with mid LAD 30%, proximal to mid RCA 20%, OM2 30%, OM2 2nd lesion 30%, RPDA 60%, dilated cardiomyopathy with diffuse HK. Echo showed a reduced EF at 15%, mild concentric LVH, mild AI. Magnesium was found to be 1.6 upon admission. Status post repletion 2.0 on 9/19. K+ was 4.1 upon admission and 4.1 at time of EP consult. TSH normal. Troponin negative. BNP 3,455. Digoxin 0.2. He has been diuresed with IV Lasix with good results, minus 1.6 L for the admission and with noted upward trend in his renal function to SCr of 1.72 and BUN of 40 today. On tele this morning he has had an increase in PVC burden with a 4 beat run of NSVT at 7:38 AM. Asymptomatic.     Past Medical History:  Diagnosis Date  . Chronic systolic CHF (congestive heart failure) (HCC)    a. see echo   . Coronary  artery disease, non-occlusive    a. cardiac cath 04/16/16: mLAD 30%, p-mRCA 20%, OM2 30% x 2, RPDA 60%, dilated cardiomyopathy w/ diffuse HK, nl right pressures  . Enlarged heart   . Frequent PVCs   . Hypertension   . NICM (nonischemic cardiomyopathy) ([place])    a. echo 04/14/16: EF 15%, mild concentric LVH, mild AI  . Prostate cancer ([place])   . Sleep apnea   . Stroke ([place])   . VT (ventricular tachycardia) ([place]) 04/14/2016   a. monomorphic in LBBB pattern       Most Recent Cardiac Studies: Cardiac cath 04/16/16: Conclusion     2nd Mrg-2 lesion, 30 %stenosed.  2nd Mrg-1 lesion, 30 %stenosed.  Mid LAD lesion, 30 %stenosed.  RPDA lesion, 60 %stenosed.  Prox RCA to Mid RCA lesion, 20 %stenosed.   1. Insignificant coronary artery disease 2. Severe dilated cardiomyopathy with diffuse hypokinesis 3. Normal right-sided heart pressures  Recommendations 1. Continue medical therapy 2. Elective CRT-D   Echo 04/14/16: Study Conclusions - Left ventricle: The cavity size was severely dilated. There was   mild concentric hypertrophy. The estimated ejection fraction was   15%. - Aortic valve: There was mild regurgitation.   Surgical History:  Past Surgical History:  Procedure Laterality Date  . CARDIAC CATHETERIZATION N/A 04/16/2016   Procedure: Right/Left Heart Cath and Coronary Angiography;  Surgeon: Isaias Cowman, MD;  Location: [facility];  Service: Cardiovascular;  Laterality: N/A;  . Prostate I-125 seed implant  10/12/14  . tumor removed from  colon       Home Meds: Prior to Admission medications   Medication Sig Start Date End Date Taking? Authorizing Provider  amLODipine (NORVASC) 10 MG tablet Take 10 mg by mouth daily.   Yes Historical Provider, MD  aspirin 81 MG tablet Take 81 mg by mouth daily.   Yes Historical Provider, MD  atorvastatin (LIPITOR) 20 MG tablet Take 20 mg by mouth daily.   Yes Historical Provider, MD  carvedilol (COREG) 25 MG tablet Take  25 mg by mouth 2 (two) times daily with a meal.   Yes Historical Provider, MD  digoxin (LANOXIN) 0.125 MG tablet Take 0.125 mg by mouth every other day.   Yes Historical Provider, MD  enalapril (VASOTEC) 10 MG tablet Take 10 mg by mouth 2 (two) times daily.   Yes Historical Provider, MD  NIFEdipine (PROCARDIA XL/ADALAT-CC) 60 MG 24 hr tablet Take 60 mg by mouth daily.   Yes Historical Provider, MD  spironolactone (ALDACTONE) 25 MG tablet Take 25 mg by mouth daily.   Yes Historical Provider, MD    Inpatient Medications:  . amiodarone  600 mg Oral BID  . aspirin EC  81 mg Oral Daily  . atorvastatin  20 mg Oral Daily  . budesonide (PULMICORT) nebulizer solution  0.25 mg Nebulization BID  . carvedilol  12.5 mg Oral BID WC  . docusate sodium  100 mg Oral BID  . enoxaparin (LOVENOX) injection  40 mg Subcutaneous Q24H  . furosemide  40 mg Oral Daily  . ipratropium-albuterol  3 mL Nebulization BID  . magnesium oxide  400 mg Oral BID  . potassium chloride  20 mEq Oral BID  . sodium chloride flush  3 mL Intravenous Q12H   . sodium chloride 50 mL/hr at 04/18/16 [id number]    Allergies: No Known Allergies  Social History   Social History  . Marital status: Single    Spouse name: N/A  . Number of children: N/A  . Years of education: N/A   Occupational History  . Not on file.   Social History Main Topics  . Smoking status: Former Smoker    Types: Cigarettes    Quit date: 04/28/2014  . Smokeless tobacco: Never Used  . Alcohol use 0.6 oz/week    1 Cans of beer per week     Comment: occassionally  . Drug use: No  . Sexual activity: Not on file   Other Topics Concern  . Not on file   Social History Narrative  . No narrative on file     Family History  Problem Relation Age of Onset  . Kidney cancer Neg Hx   . Prostate cancer Neg Hx      Review of Systems: Review of Systems  Constitutional: Positive for malaise/fatigue. Negative for chills, diaphoresis, fever and weight loss.    HENT: Negative for congestion.   Eyes: Negative for discharge and redness.  Respiratory: Negative for cough, sputum production, shortness of breath and wheezing.   Cardiovascular: Positive for palpitations. Negative for chest pain, orthopnea, claudication, leg swelling and PND.  Gastrointestinal: Negative for abdominal pain, heartburn, nausea and vomiting.  Musculoskeletal: Negative for falls and myalgias.  Skin: Negative for rash.  Neurological: Positive for weakness. Negative for dizziness, tingling, tremors, sensory change, speech change, focal weakness and loss of consciousness.  Endo/Heme/Allergies: Does not bruise/bleed easily.  Psychiatric/Behavioral: Negative for substance abuse. The patient is not nervous/anxious.   All other systems reviewed and are negative.   Labs: No results for  input(s): CKTOTAL, CKMB, TROPONINI in the last 72 hours. Lab Results  Component Value Date   WBC 8.5 04/18/2016   HGB 17.0 04/18/2016   HCT 52.4 (H) 04/18/2016   MCV 80.9 04/18/2016   PLT 195 04/18/2016    Recent Labs Lab 04/15/16 0610  04/18/16 0423  NA 138  < > 137  K 4.3  < > 4.1  CL 101  < > 105  CO2 25  < > 25  BUN 23*  < > 40*  CREATININE 1.27*  < > 1.72*  CALCIUM 9.0  < > 9.0  PROT 7.4  --   --   BILITOT 1.4*  --   --   ALKPHOS 50  --   --   ALT 77*  --   --   AST 60*  --   --   GLUCOSE 104*  < > 105*  < > = values in this interval not displayed. Lab Results  Component Value Date   CHOL 144 05/04/2015   HDL 31 (L) 05/04/2015   LDLCALC 91 05/04/2015   TRIG 110 05/04/2015   No results found for: DDIMER  Radiology/Studies:  Portable Chest 1 View  Result Date: 04/15/2016 CLINICAL DATA:  60 year old male with pulmonary edema suspected on chest radiographs performed for altered mental status and tachycardia. Initial encounter. EXAM: PORTABLE CHEST 1 VIEW COMPARISON:  04/14/2016 and earlier. FINDINGS: Portable AP upright view at 0532 hours. Stable cardiomegaly and  mediastinal contours. Regressed pulmonary vascularity. Now allowing for portable technique the lungs are clear. No pneumothorax. No pleural effusion identified. Calcified aortic atherosclerosis. IMPRESSION: Resolved vascular congestion/interstitial edema. No acute cardiopulmonary abnormality. Cardiomegaly and Calcified aortic atherosclerosis. Electronically Signed   By: Genevie Ann M.D.   On: 04/15/2016 07:44   Dg Chest Portable 1 View  Result Date: 04/14/2016 CLINICAL DATA:  Patient with mental status change. Increased heart rate. EXAM: PORTABLE CHEST 1 VIEW COMPARISON:  Chest radiograph 04/14/2016. FINDINGS: Pacer apparatus overlies the left hemi thorax, stable cardiomegaly. Mild bilateral perihilar interstitial opacities. No pleural effusion or pneumothorax. Regional skeleton is unremarkable. IMPRESSION: Cardiomegaly.  Mild interstitial pulmonary edema. Electronically Signed   By: Lovey Newcomer M.D.   On: 04/14/2016 14:24   Dg Chest Portable 1 View  Result Date: 04/14/2016 CLINICAL DATA:  Chest pain and ventricular tachycardia. EXAM: PORTABLE CHEST 1 VIEW COMPARISON:  09/17/2008 chest radiograph FINDINGS: Upper limits normal heart size noted. Pulmonary vascular congestion is present. Defibrillator pads overlying the left chest identified. There is no evidence of focal airspace disease, pulmonary edema, suspicious pulmonary nodule/mass, pleural effusion, or pneumothorax. No acute bony abnormalities are identified. IMPRESSION: Upper limits normal heart size with pulmonary vascular congestion. Electronically Signed   By: Margarette Canada M.D.   On: 04/14/2016 11:11    EKG: Interpreted by me showed: Admission - Initially NSR with frequent PVCs in a pattern of ventricular bigeminy, degenerated to sustained monomorphic VT, 185 bpm Telemetry: Interpreted by me showed: NSR, 70's bpm, frequent PVCs, ventricular couplets, 4 beats of NSVT at 7:38 AM  Weights: Filed Weights   04/16/16 0500 04/17/16 0627 04/18/16 0450   Weight: 200 lb (90.7 kg) 199 lb 12.8 oz (90.6 kg) 196 lb 9.6 oz (89.2 kg)     Physical Exam: Blood pressure 110/79, pulse 80, temperature 97.5 F (36.4 C), temperature source Oral, resp. rate 18, height 6\' 1"  (1.854 m), weight 196 lb 9.6 oz (89.2 kg), SpO2 97 %. Body mass index is 25.94 kg/m. General: Well developed, well nourished,  in no acute distress. Head: Normocephalic, atraumatic, sclera non-icteric, no xanthomas, nares are without discharge.  Neck: Negative for carotid bruits. JVD not elevated. Lungs: Clear bilaterally to auscultation without wheezes, rales, or rhonchi. Breathing is unlabored. Heart: Irregular with S1 S2. No murmurs, rubs, or gallops appreciated. Abdomen: Soft, non-tender, non-distended with normoactive bowel sounds. No hepatomegaly. No rebound/guarding. No obvious abdominal masses. Msk:  Strength and tone appear normal for age. Extremities: No clubbing or cyanosis. No edema. Distal pedal pulses are 2+ and equal bilaterally. Neuro: Alert and oriented X 3. No facial asymmetry. No focal deficit. Moves all extremities spontaneously. Psych:  Responds to questions appropriately with a normal affect.    Assessment and Plan:  Principal Problem:   Monomorphic ventricular tachycardia (HCC) Active Problems:   Acute on chronic systolic (congestive) heart failure (HCC)   Coronary artery disease, non-occlusive   NICM (nonischemic cardiomyopathy) (HCC)   Hypomagnesemia   Accelerated hypertension   AKI (acute kidney injury) ([place])   HLD (hyperlipidemia)    1. WCT c/w monomorphic sustained VT/frequent PVCs: -EF noted to be 15% by echo upon admission. Nonobstructive CAD by cardiac cath this admission -He continues to have significant PVC burden on tele this morning. His cardiomyopathy may be 2/2 PVC burden. Will need to decrease this burden medically with follow up echo to assess for possible improvement in his EF. If his EF does not improve with decreased PVC burden he  would then likely require ICD implantation.  -Increase Coreg to 12.5 mg bid as well as amiodarone to 600 mg bid -Start magnesium oxide 400 mg bid -Monitor on telemetry  -If he has further runs of VT on tele may need ICD as inpatient -Will likely need 48-72 hours further of inpatient evaluation/treatment   2. Acute on chronic systolic CHF/dilated cardiomyopathy/NICM: -Has diuresed 1.6 L for the admission -Less SOB -Renal function has continued to increase -Change IV Lasix to PO  -If renal function continues to increase may need to hold on 9/22 -KCl repletion -Coreg as above -Not on ACEi/ARB or Entresto at this time 2/2 AKI. Start when able  3. Nonobstructive CAD/mildly elevated troponin: -Troponin bump mild and flat trending, likely supply demand ischemia in the setting of his VT -ASA -Coreg -No symptoms concerning for angina at this time  4. HTN: -Stable -Continue current medications  5. AKI: -As above -Cautious use of fluids given his cardiomyopathy  6. Hypomagnesemia: -Mag ox 400 mg bid  7. HLD: -Lipitor    Signed, Christell Faith, PA-C [place] Pager: [phone] 04/18/2016, 9:47 AM  As above   Patient seen and examined. There is been a marked increase in ventricular tachycardia over the last 8-12 hours perhaps coincidental with the change in his amiodarone from IV--by mouth. It is now comprising more than 15-20% of his beats. I think suppression of his ventricular ectopy prior to discharge is important. We will increase his amiodarone as well as his carvedilol as noted above. We will add magnesium for further membrane stability. We'll follow telemetry closely with you.

## 2016-04-19 ENCOUNTER — Telehealth: Payer: Self-pay | Admitting: Internal Medicine

## 2016-04-19 ENCOUNTER — Other Ambulatory Visit: Payer: Self-pay | Admitting: *Deleted

## 2016-04-19 DIAGNOSIS — I493 Ventricular premature depolarization: Secondary | ICD-10-CM

## 2016-04-19 LAB — CBC
HEMATOCRIT: 54.4 % — AB (ref 40.0–52.0)
Hemoglobin: 18.2 g/dL — ABNORMAL HIGH (ref 13.0–18.0)
MCH: 26.6 pg (ref 26.0–34.0)
MCHC: 33.5 g/dL (ref 32.0–36.0)
MCV: 79.4 fL — AB (ref 80.0–100.0)
Platelets: 212 10*3/uL (ref 150–440)
RBC: 6.85 MIL/uL — AB (ref 4.40–5.90)
RDW: 16.3 % — ABNORMAL HIGH (ref 11.5–14.5)
WBC: 8.2 10*3/uL (ref 3.8–10.6)

## 2016-04-19 LAB — BASIC METABOLIC PANEL
Anion gap: 9 (ref 5–15)
BUN: 33 mg/dL — ABNORMAL HIGH (ref 6–20)
CHLORIDE: 103 mmol/L (ref 101–111)
CO2: 25 mmol/L (ref 22–32)
Calcium: 9.3 mg/dL (ref 8.9–10.3)
Creatinine, Ser: 1.48 mg/dL — ABNORMAL HIGH (ref 0.61–1.24)
GFR calc non Af Amer: 50 mL/min — ABNORMAL LOW (ref >60)
GFR, EST AFRICAN AMERICAN: 58 mL/min — AB (ref >60)
Glucose, Bld: 101 mg/dL — ABNORMAL HIGH (ref 65–99)
POTASSIUM: 4.6 mmol/L (ref 3.5–5.1)
SODIUM: 137 mmol/L (ref 135–145)

## 2016-04-19 LAB — MAGNESIUM: Magnesium: 2.2 mg/dL (ref 1.7–2.4)

## 2016-04-19 MED ORDER — AMIODARONE HCL 200 MG PO TABS: 600.0000 mg | ORAL_TABLET | Freq: Two times a day (BID) | ORAL | 0 refills | Status: AC

## 2016-04-19 MED ORDER — CARVEDILOL 12.5 MG PO TABS: 12.5000 mg | ORAL_TABLET | Freq: Two times a day (BID) | ORAL | 0 refills | Status: AC

## 2016-04-19 MED ORDER — MAGNESIUM OXIDE 400 (241.3 MG) MG PO TABS: 400.0000 mg | ORAL_TABLET | Freq: Two times a day (BID) | ORAL | 0 refills | Status: AC

## 2016-04-19 NOTE — Telephone Encounter (Signed)
lmov for patient to call back and schedule this

## 2016-04-19 NOTE — Progress Notes (Signed)
SUBJECTIVE:  I am seeing the patient on behalf of Dr. Caryl Comes for frequent PVCs and cardiomyopathy. The dose of amiodarone and carvedilol were increased yesterday with the addition of magnesium. Since then, it appears that there has been significant improvement in the burden of PVCs. The patient denies palpitations. Dyspnea is stable.   Vitals:   04/19/16 0621 04/19/16 0735 04/19/16 0759 04/19/16 1013  BP: 122/84  123/81 118/82  Pulse: 70  72   Resp: 16  (!) 22   Temp: 97.8 F (36.6 C)  97.6 F (36.4 C)   TempSrc:   Oral   SpO2: 92% 94% 95%   Weight: 197 lb 12.8 oz (89.7 kg)     Height:        Intake/Output Summary (Last 24 hours) at 04/19/16 1123 Last data filed at 04/19/16 0954  Gross per 24 hour  Intake              480 ml  Output             1525 ml  Net            -1045 ml    LABS: Basic Metabolic Panel:  Recent Labs  04/18/16 0423 04/19/16 0428  NA 137 137  K 4.1 4.6  CL 105 103  CO2 25 25  GLUCOSE 105* 101*  BUN 40* 33*  CREATININE 1.72* 1.48*  CALCIUM 9.0 9.3  MG 2.0 2.2   Liver Function Tests: No results for input(s): AST, ALT, ALKPHOS, BILITOT, PROT, ALBUMIN in the last 72 hours. No results for input(s): LIPASE, AMYLASE in the last 72 hours. CBC:  Recent Labs  04/18/16 0423 04/19/16 0428  WBC 8.5 8.2  HGB 17.0 18.2*  HCT 52.4* 54.4*  MCV 80.9 79.4*  PLT 195 212   Cardiac Enzymes: No results for input(s): CKTOTAL, CKMB, CKMBINDEX, TROPONINI in the last 72 hours. BNP: Invalid input(s): POCBNP D-Dimer: No results for input(s): DDIMER in the last 72 hours. Hemoglobin A1C: No results for input(s): HGBA1C in the last 72 hours. Fasting Lipid Panel: No results for input(s): CHOL, HDL, LDLCALC, TRIG, CHOLHDL, LDLDIRECT in the last 72 hours. Thyroid Function Tests: No results for input(s): TSH, T4TOTAL, T3FREE, THYROIDAB in the last 72 hours.  Invalid input(s): FREET3 Anemia Panel: No results for input(s): VITAMINB12, FOLATE, FERRITIN, TIBC,  IRON, RETICCTPCT in the last 72 hours.   PHYSICAL EXAM General: Well developed, well nourished, in no acute distress HEENT:  Normocephalic and atramatic Neck:  No JVD.  Lungs: Clear bilaterally to auscultation and percussion. Heart: HRRR . Normal S1 and S2 without gallops or murmurs.  Abdomen: Bowel sounds are positive, abdomen soft and non-tender  Msk:  Back normal, normal gait. Normal strength and tone for age. Extremities: No clubbing, cyanosis or edema.   Neuro: Alert and oriented X 3. Psych:  Good affect, responds appropriately  TELEMETRY: Reviewed telemetry pt in normal sinus rhythm:  ASSESSMENT AND PLAN:   1. 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 Significant improvement in the burden of PVCs since increasing the medications. I discussed the case with Dr. Caryl Comes. The patient can be discharged home from our standpoint on: Amiodarone 600 mg twice daily for one week then 400 mg twice daily for another week and then 400 mg once daily. Continue carvedilol 12.5 mg twice daily and magnesium twice daily. We will arrange for follow-up with Dr. Caryl Comes in 3-4 weeks with a Holter monitor before that.  2. Acute on chronic systolic heart failure with an ejection fraction of 15%. Cardiac catheterization showed no evidence of coronary artery disease. He is recovering from acute on chronic renal failure. Gary [name] can be considered as an outpatient.  Gary Sacramento, MD, [facility] 04/19/2016 11:23 AM

## 2016-04-19 NOTE — Telephone Encounter (Signed)
-----   Message from Emily Filbert, RN sent at 04/19/2016 12:56 PM EDT ----- Can you girls help with this- I put the order in for the holter.  Thanks! ----- Message ----- From: Wellington Hampshire, MD Sent: 04/19/2016  11:28 AM To: Emily Filbert, RN  This is a patient of Dr. Caryl Comes being discharged from [facility].  Needs a 24 hour Holter monitor in 2 weeks for PVCs and follow up with him shortly after that.

## 2016-04-19 NOTE — Discharge Instructions (Signed)
Heart Failure Clinic appointment on May 09, 2016 at 11:30am with Darylene Price, [place]. Please call [phone] to reschedule.    Heart Failure Heart failure means your heart has trouble pumping blood. This makes it hard for your body to work well. Heart failure is usually a long-term (chronic) condition. You must take good care of yourself and follow your doctor's treatment plan. HOME CARE  Take your heart medicine as told by your doctor.  Do not stop taking medicine unless your doctor tells you to.  Do not skip any dose of medicine.  Refill your medicines before they run out.  Take other medicines only as told by your doctor or pharmacist.  Stay active if told by your doctor. The elderly and people with severe heart failure should talk with a doctor about physical activity.  Eat heart-healthy foods. Choose foods that are without trans fat and are low in saturated fat, cholesterol, and salt (sodium). This includes fresh or frozen fruits and vegetables, fish, lean meats, fat-free or low-fat dairy foods, whole grains, and high-fiber foods. Lentils and dried peas and beans (legumes) are also good choices.  Limit salt if told by your doctor.  Cook in a healthy way. Roast, grill, broil, bake, poach, steam, or stir-fry foods.  Limit fluids as told by your doctor.  Weigh yourself every morning. Do this after you pee (urinate) and before you eat breakfast. Write down your weight to give to your doctor.  Take your blood pressure and write it down if your doctor tells you to.  Ask your doctor how to check your pulse. Check your pulse as told.  Lose weight if told by your doctor.  Stop smoking or chewing tobacco. Do not use gum or patches that help you quit without your doctor's approval.  Schedule and go to doctor visits as told.  Nonpregnant women should have no more than 1 drink a day. Men should have no more than 2 drinks a day. Talk to your doctor about drinking alcohol.  Stop  illegal drug use.  Stay current with shots (immunizations).  Manage your health conditions as told by your doctor.  Learn to manage your stress.  Rest when you are tired.  If it is really hot outside:  Avoid intense activities.  Use air conditioning or fans, or get in a cooler place.  Avoid caffeine and alcohol.  Wear loose-fitting, lightweight, and light-colored clothing.  If it is really cold outside:  Avoid intense activities.  Layer your clothing.  Wear mittens or gloves, a hat, and a scarf when going outside.  Avoid alcohol.  Learn about heart failure and get support as needed.  Get help to maintain or improve your quality of life and your ability to care for yourself as needed. GET HELP IF:   You gain weight quickly.  You are more short of breath than usual.  You cannot do your normal activities.  You tire easily.  You cough more than normal, especially with activity.  You have any or more puffiness (swelling) in areas such as your hands, feet, ankles, or belly (abdomen).  You cannot sleep because it is hard to breathe.  You feel like your heart is beating fast (palpitations).  You get dizzy or light-headed when you stand up. GET HELP RIGHT AWAY IF:   You have trouble breathing.  There is a change in mental status, such as becoming less alert or not being able to focus.  You have chest pain or discomfort.  You  faint. MAKE SURE YOU:   Understand these instructions.  Will watch your condition.  Will get help right away if you are not doing well or get worse.   This information is not intended to replace advice given to you by your health care provider. Make sure you discuss any questions you have with your health care provider.   Document Released: 04/23/2008 Document Revised: 08/05/2014 Document Reviewed: 08/31/2012 Elsevier Interactive Patient Education [employer].

## 2016-04-19 NOTE — Care Management (Signed)
Patient for discharge home today.  There are no discharge needs identified by care team members or patient.

## 2016-04-19 NOTE — Progress Notes (Addendum)
Per Dr. Candiss Norse, since labs are better and patient is discharging today, schedule outpatient follow-up in about 2 weeks.   Office closed for lunch at this time. Patient ready to leave. Gave him Dr. Keturah Barre business card. Patient said we could leave a message on his answering machine with the appointment time.

## 2016-04-19 NOTE — Progress Notes (Signed)
Patient given discharge teaching and paperwork regarding medications, diet, follow-up appointments and activity. Patient understanding verbalized. No complaints at this time. IV and telemetry discontinued prior to leaving. Skin assessment as previously charted and vitals are stable; on room air. Patient being discharged to home. Family present during discharge teaching. No further needs by Care Management. Prescriptions e-prescribed to pharmacy.

## 2016-04-19 NOTE — Progress Notes (Signed)
[place] consulted to monitor amiodarone drug interactions in this 34 yoM who has a past medical history significant for HTN, CHF, and cardiomyopathy now with tachypalpitations and sweats. Pt was found to be in ventricular tachycardia and started on Amiodarone.  Assessment/Plan  1. Amiodarone drug interactions - patient was taking digoxin prior to admission but has not been restarted on this medication. Amiodarone could potentially increase the effects of carvedilol but pt is currently within normal HR. Amiodarone and atorvastatin may result in increased risk of myopathy, but no symptoms of myopathy or rhabdomyolysis. No other medication adjustments needed. Pharmacy will continue to monitor.  No Known Allergies  Patient Measurements: Height: 6\' 1"  (185.4 cm) Weight: 197 lb 12.8 oz (89.7 kg) IBW/kg (Calculated) : 79.9  Vital Signs: Temp: 97.6 F (36.4 C) (09/22 0759) Temp Source: Oral (09/22 0759) BP: 118/82 (09/22 1013) Pulse Rate: 72 (09/22 0759) Intake/Output from previous day: 09/21 0701 - 09/22 0700 In: 240 [P.O.:240] Out: 2625 [Urine:2625] Intake/Output from this shift: Total I/O In: 240 [P.O.:240] Out: 150 [Urine:150]  Labs:  Recent Labs  04/18/16 0423 04/19/16 0428  WBC 8.5 8.2  HGB 17.0 18.2*  HCT 52.4* 54.4*  PLT 195 212     Recent Labs  04/18/16 0423 04/19/16 0428  NA 137 137  K 4.1 4.6  CL 105 103  CO2 25 25  GLUCOSE 105* 101*  BUN 40* 33*  CREATININE 1.72* 1.48*  CALCIUM 9.0 9.3  MG 2.0 2.2   Estimated Creatinine Clearance: 60 mL/min (by C-G formula based on SCr of 1.48 mg/dL (H)).   No results for input(s): GLUCAP in the last 72 hours.  Medications:  Scheduled:  . amiodarone  600 mg Oral BID  . aspirin EC  81 mg Oral Daily  . atorvastatin  20 mg Oral Daily  . budesonide (PULMICORT) nebulizer solution  0.25 mg Nebulization BID  . carvedilol  12.5 mg Oral BID WC  . docusate sodium  100 mg Oral BID  . enoxaparin (LOVENOX)  injection  40 mg Subcutaneous Q24H  . furosemide  40 mg Oral Daily  . ipratropium-albuterol  3 mL Nebulization BID  . magnesium oxide  400 mg Oral BID  . sodium chloride flush  3 mL Intravenous Q12H   Infusions:  . sodium chloride 50 mL/hr at 04/19/16 0227     Carmin Richmond, PharmD Pharmacy Resident 04/19/2016 10:50 AM

## 2016-04-19 NOTE — Care Management Important Message (Signed)
Important Message  Patient Details  Name: Gary [name] MRN: [id number] Date of Birth: 16-May-1956   Medicare Important Message Given:  Yes    Katrina Stack, RN 04/19/2016, 12:41 PM

## 2016-04-19 NOTE — Discharge Summary (Signed)
[facility] [place] NAME: Gary [name]    MR#:  [id number]  DATE OF BIRTH:  October 30, 1955  DATE OF ADMISSION:  04/14/2016   ADMITTING PHYSICIAN: Idelle Crouch, MD  DATE OF DISCHARGE: 04/19/2016  1:40 PM  PRIMARY CARE PHYSICIAN: Lavera Guise, MD   ADMISSION DIAGNOSIS:  Palpitations [R00.2] Ventricular tachyarrhythmia ([place]) [I47.2] DISCHARGE DIAGNOSIS:  Principal Problem:   Monomorphic ventricular tachycardia (HCC) Active Problems:   Accelerated hypertension   HLD (hyperlipidemia)   Acute on chronic systolic (congestive) heart failure (HCC)   Coronary artery disease, non-occlusive   AKI (acute kidney injury) ([place])   NICM (nonischemic cardiomyopathy) ([place])   Hypomagnesemia  SECONDARY DIAGNOSIS:   Past Medical History:  Diagnosis Date  . Chronic systolic CHF (congestive heart failure) (HCC)    a. see echo   . Coronary artery disease, non-occlusive    a. cardiac cath 04/16/16: mLAD 30%, p-mRCA 20%, OM2 30% x 2, RPDA 60%, dilated cardiomyopathy w/ diffuse HK, nl right pressures  . Enlarged heart   . Frequent PVCs   . Hypertension   . NICM (nonischemic cardiomyopathy) ([place])    a. echo 04/14/16: EF 15%, mild concentric LVH, mild AI  . Prostate cancer ([place])   . Sleep apnea   . Stroke ([place])   . VT (ventricular tachycardia) ([place]) 04/14/2016   a. monomorphic in LBBB pattern    HOSPITAL COURSE:  60 year old male with a history of cardiomyopathy EF 15% and essential hypertension Admitted with shortness of breath with palpitations, diaphoresis and found to have wide complex tachycardia.  1. 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 Nonobstructive CAD by cardiac cath this admission. - Significant improvement in the burden of PVCs while on medications. - Cardio recommends Amiodarone 600 mg twice daily for one week then 400 mg twice daily for another  week and then 400 mg once daily. Continue carvedilol 12.5 mg twice daily and magnesium twice daily. - Outpt follow-up with Dr. Caryl Comes in 3-4 weeks with a Holter monitor before that being arranged by Cardio  2. Acute on chronic systolic CHF/dilated cardiomyopathy/NICM: - well compensated now -Not on ACEi/ARB due to worsening renal function - may require [place] to hold Lasix  3. ARF: Monitor, Creat 1.27->1.69->1.72->1.48 - due to prerenal etio  4. mildly elevated troponin: -Due to supply demand ischemia  5. HTN: - on Coreg and amiodarone  6. Hypomagnesemia: - Continue magnesium supplementation, Magnesium close to 2.0  7.  Hyperlipidemia - Continue Lipitor  DISCHARGE CONDITIONS:  STABLE CONSULTS OBTAINED:  Treatment Team:  Isaias Cowman, MD Deboraha Sprang, MD DRUG ALLERGIES:  No Known Allergies DISCHARGE MEDICATIONS:     Medication List    TAKE these medications   amiodarone 200 MG tablet Commonly known as:  PACERONE Take 3 tablets (600 mg total) by mouth 2 (two) times daily.   amLODipine 10 MG tablet Commonly known as:  NORVASC Take 10 mg by mouth daily.   aspirin 81 MG tablet Take 81 mg by mouth daily.   atorvastatin 20 MG tablet Commonly known as:  LIPITOR Take 20 mg by mouth daily.   carvedilol 12.5 MG tablet Commonly known as:  COREG Take 1 tablet (12.5 mg total) by mouth 2 (two) times daily with a meal. What changed:  medication strength  how much to take   digoxin 0.125 MG tablet Commonly known as:  LANOXIN Take 0.125 mg by mouth every other  day.   enalapril 10 MG tablet Commonly known as:  VASOTEC Take 10 mg by mouth 2 (two) times daily.   magnesium oxide 400 (241.3 Mg) MG tablet Commonly known as:  MAG-OX Take 1 tablet (400 mg total) by mouth 2 (two) times daily.   NIFEdipine 60 MG 24 hr tablet Commonly known as:  PROCARDIA XL/ADALAT-CC Take 60 mg by mouth daily.   spironolactone 25 MG tablet Commonly known as:   ALDACTONE Take 25 mg by mouth daily.        DISCHARGE INSTRUCTIONS:   DIET:  Cardiac diet DISCHARGE CONDITION:  Good ACTIVITY:  Activity as tolerated OXYGEN:  Home Oxygen: No.  Oxygen Delivery: room air DISCHARGE LOCATION:  home   If you experience worsening of your admission symptoms, develop shortness of breath, life threatening emergency, suicidal or homicidal thoughts you must seek medical attention immediately by calling 911 or calling your MD immediately  if symptoms less severe.  You Must read complete instructions/literature along with all the possible adverse reactions/side effects for all the Medicines you take and that have been prescribed to you. Take any new Medicines after you have completely understood and accpet all the possible adverse reactions/side effects.   Please note  You were cared for by a hospitalist during your hospital stay. If you have any questions about your discharge medications or the care you received while you were in the hospital after you are discharged, you can call the unit and asked to speak with the hospitalist on call if the hospitalist that took care of you is not available. Once you are discharged, your primary care physician will handle any further medical issues. Please note that NO REFILLS for any discharge medications will be authorized once you are discharged, as it is imperative that you return to your primary care physician (or establish a relationship with a primary care physician if you do not have one) for your aftercare needs so that they can reassess your need for medications and monitor your lab values.    On the day of Discharge:  VITAL SIGNS:  Blood pressure (!) 148/97, pulse 74, temperature 98 F (36.7 C), resp. rate 16, height 6\' 1"  (1.854 m), weight 89.7 kg (197 lb 12.8 oz), SpO2 97 %. PHYSICAL EXAMINATION:  GENERAL:  60 y.o.-year-old patient lying in the bed with no acute distress.  EYES: Pupils equal, round, reactive  to light and accommodation. No scleral icterus. Extraocular muscles intact.  HEENT: Head atraumatic, normocephalic. Oropharynx and nasopharynx clear.  NECK:  Supple, no jugular venous distention. No thyroid enlargement, no tenderness.  LUNGS: Normal breath sounds bilaterally, no wheezing, rales,rhonchi or crepitation. No use of accessory muscles of respiration.  CARDIOVASCULAR: S1, S2 normal. No murmurs, rubs, or gallops.  ABDOMEN: Soft, non-tender, non-distended. Bowel sounds present. No organomegaly or mass.  EXTREMITIES: No pedal edema, cyanosis, or clubbing.  NEUROLOGIC: Cranial nerves II through XII are intact. Muscle strength 5/5 in all extremities. Sensation intact. Gait not checked.  PSYCHIATRIC: The patient is alert and oriented x 3.  SKIN: No obvious rash, lesion, or ulcer.  DATA REVIEW:   CBC  Recent Labs Lab 04/19/16 0428  WBC 8.2  HGB 18.2*  HCT 54.4*  PLT 212    Chemistries   Recent Labs Lab 04/15/16 0610  04/19/16 0428  NA 138  < > 137  K 4.3  < > 4.6  CL 101  < > 103  CO2 25  < > 25  GLUCOSE 104*  < >  101*  BUN 23*  < > 33*  CREATININE 1.27*  < > 1.48*  CALCIUM 9.0  < > 9.3  MG 1.6*  < > 2.2  AST 60*  --   --   ALT 77*  --   --   ALKPHOS 50  --   --   BILITOT 1.4*  --   --   < > = values in this interval not displayed.   Follow-up Information    Alisa Graff, FNP. Go on 05/09/2016.   Specialty:  Family Medicine Why:  at 11:30am to the Heart Failure Clinic Contact information: [place] [address] [phone]        Lavera Guise, MD. Schedule an appointment as soon as possible for a visit in 2 week(s).   Specialty:  Internal Medicine Why:  PLEASE CALL THE OFFICE AND SCHEDULE THIS APPOINTMENT. Contact information: [place] [address] [phone]        Virl Axe, MD. Go on 04/29/2016.   Specialty:  Cardiology Why:  appointment at 10:30 am with Ellen Henri, NP Contact  information: [id number] [place] [address] [phone]        Murlean Iba, MD. Daphane Shepherd on 05/03/2016.   Specialty:  Internal Medicine Why:  appointment at 1:00pm Contact information: [place] [address] [phone]            Management plans discussed with the patient, family and they are in agreement.  CODE STATUS: FULL CODE  TOTAL TIME TAKING CARE OF THIS PATIENT: 45 minutes.    [facility], Ishmeal Rorie M.D on 04/19/2016 at 6:25 PM  Between 7am to 6pm - Pager - [phone]  After 6pm go to [email or web address] - Proofreader  [facility]  Office  [phone]  CC: Primary care physician; Lavera Guise, MD   Note: This dictation was prepared with Dragon dictation along with smaller phrase technology. Any transcriptional errors that result from this process are unintentional.

## 2016-04-19 NOTE — Progress Notes (Signed)
MEDICATION RELATED CONSULT NOTE - INITIAL   Pharmacy Consult for electrolyte monitoring/management Indication: Keep K >/=4 and Mg >/=2  No Known Allergies  Patient Measurements: Height: 6\' 1"  (185.4 cm) Weight: 197 lb 12.8 oz (89.7 kg) IBW/kg (Calculated) : 79.9  Vital Signs: Temp: 97.8 F (36.6 C) (09/22 0621) BP: 122/84 (09/22 0621) Pulse Rate: 70 (09/22 0621) Intake/Output from previous day: 09/21 0701 - 09/22 0700 In: 240 [P.O.:240] Out: 2625 [Urine:2625] Intake/Output from this shift: No intake/output data recorded.  Labs:  Recent Labs  04/18/16 0423 04/19/16 0428  WBC 8.5 8.2  HGB 17.0 18.2*  HCT 52.4* 54.4*  PLT 195 212  CREATININE 1.72* 1.48*  MG 2.0 2.2   Estimated Creatinine Clearance: 60 mL/min (by C-G formula based on SCr of 1.48 mg/dL (H)).   Microbiology: Recent Results (from the past 720 hour(s))  MRSA PCR Screening     Status: None   Collection Time: 04/14/16  3:00 PM  Result Value Ref Range Status   MRSA by PCR NEGATIVE NEGATIVE Final    Comment:        The GeneXpert MRSA Assay (FDA approved for NASAL specimens only), is one component of a comprehensive MRSA colonization surveillance program. It is not intended to diagnose MRSA infection nor to guide or monitor treatment for MRSA infections.     Medical History: Past Medical History:  Diagnosis Date  . Chronic systolic CHF (congestive heart failure) (HCC)    a. see echo   . Coronary artery disease, non-occlusive    a. cardiac cath 04/16/16: mLAD 30%, p-mRCA 20%, OM2 30% x 2, RPDA 60%, dilated cardiomyopathy w/ diffuse HK, nl right pressures  . Enlarged heart   . Frequent PVCs   . Hypertension   . NICM (nonischemic cardiomyopathy) ([place])    a. echo 04/14/16: EF 15%, mild concentric LVH, mild AI  . Prostate cancer ([place])   . Sleep apnea   . Stroke ([place])   . VT (ventricular tachycardia) ([place]) 04/14/2016   a. monomorphic in LBBB pattern    Medications:  Scheduled:  . amiodarone   600 mg Oral BID  . aspirin EC  81 mg Oral Daily  . atorvastatin  20 mg Oral Daily  . budesonide (PULMICORT) nebulizer solution  0.25 mg Nebulization BID  . carvedilol  12.5 mg Oral BID WC  . docusate sodium  100 mg Oral BID  . enoxaparin (LOVENOX) injection  40 mg Subcutaneous Q24H  . furosemide  40 mg Oral Daily  . Influenza vac split quadrivalent PF  0.5 mL Intramuscular Tomorrow-1000  . ipratropium-albuterol  3 mL Nebulization BID  . magnesium oxide  400 mg Oral BID  . potassium chloride  20 mEq Oral BID  . sodium chloride flush  3 mL Intravenous Q12H    Assessment: Pharmacy consulted to assist with monitoring electrolytes in this 60 year old male. Per MD, would like to keep K ~4 and Mg ~2.   9/21:K= 4.1, Mg = 2.0  Goal of Therapy:  Mg ~2, K ~4  Plan:  9/22: K=4.6, Mg=2.2 No supplementation needed at this time.  Continue current orders for mag-ox 400 mg PO BID and KCl 20 mEq PO BID.  Will check electrolytes with AM labs tomorrow.  Loree Fee, PharmD 04/19/2016,7:45 AM

## 2016-04-23 NOTE — Telephone Encounter (Signed)
2nd attempt: Lmov for patient to call back and schedule apt for Holter monitor

## 2016-04-29 ENCOUNTER — Encounter: Payer: Medicare Other | Admitting: Cardiology

## 2016-04-30 ENCOUNTER — Emergency Department
Admission: EM | Admit: 2016-04-30 | Discharge: 2016-05-29 | Disposition: E | Payer: Medicare Other | Attending: Emergency Medicine | Admitting: Emergency Medicine

## 2016-04-30 ENCOUNTER — Emergency Department: Payer: Medicare Other

## 2016-04-30 DIAGNOSIS — I469 Cardiac arrest, cause unspecified: Secondary | ICD-10-CM | POA: Insufficient documentation

## 2016-04-30 DIAGNOSIS — Z5181 Encounter for therapeutic drug level monitoring: Secondary | ICD-10-CM | POA: Diagnosis not present

## 2016-04-30 DIAGNOSIS — Z87891 Personal history of nicotine dependence: Secondary | ICD-10-CM | POA: Diagnosis not present

## 2016-04-30 DIAGNOSIS — I2510 Atherosclerotic heart disease of native coronary artery without angina pectoris: Secondary | ICD-10-CM | POA: Insufficient documentation

## 2016-04-30 DIAGNOSIS — Z8546 Personal history of malignant neoplasm of prostate: Secondary | ICD-10-CM | POA: Insufficient documentation

## 2016-04-30 DIAGNOSIS — Z7982 Long term (current) use of aspirin: Secondary | ICD-10-CM | POA: Insufficient documentation

## 2016-04-30 DIAGNOSIS — R4020 Unspecified coma: Secondary | ICD-10-CM | POA: Diagnosis present

## 2016-04-30 DIAGNOSIS — I10 Essential (primary) hypertension: Secondary | ICD-10-CM | POA: Diagnosis not present

## 2016-04-30 LAB — COMPREHENSIVE METABOLIC PANEL
ALK PHOS: 44 U/L (ref 38–126)
ALT: 213 U/L — ABNORMAL HIGH (ref 17–63)
AST: 242 U/L — AB (ref 15–41)
Albumin: 2.4 g/dL — ABNORMAL LOW (ref 3.5–5.0)
Anion gap: 19 — ABNORMAL HIGH (ref 5–15)
BILIRUBIN TOTAL: 0.4 mg/dL (ref 0.3–1.2)
BUN: 17 mg/dL (ref 6–20)
CALCIUM: 11 mg/dL — AB (ref 8.9–10.3)
CO2: 10 mmol/L — ABNORMAL LOW (ref 22–32)
CREATININE: 2.19 mg/dL — AB (ref 0.61–1.24)
Chloride: 114 mmol/L — ABNORMAL HIGH (ref 101–111)
GFR calc Af Amer: 36 mL/min — ABNORMAL LOW (ref >60)
GFR, EST NON AFRICAN AMERICAN: 31 mL/min — AB (ref >60)
Glucose, Bld: 156 mg/dL — ABNORMAL HIGH (ref 65–99)
POTASSIUM: 6.3 mmol/L — AB (ref 3.5–5.1)
Sodium: 143 mmol/L (ref 135–145)
TOTAL PROTEIN: 4.9 g/dL — AB (ref 6.5–8.1)

## 2016-04-30 LAB — CBC WITH DIFFERENTIAL/PLATELET
BASOS ABS: 0.1 10*3/uL (ref 0–0.1)
Basophils Relative: 1 %
EOS PCT: 2 %
Eosinophils Absolute: 0.2 10*3/uL (ref 0–0.7)
HEMATOCRIT: 46.7 % (ref 40.0–52.0)
Hemoglobin: 13.9 g/dL (ref 13.0–18.0)
LYMPHS PCT: 64 %
Lymphs Abs: 5 10*3/uL — ABNORMAL HIGH (ref 1.0–3.6)
MCH: 26.4 pg (ref 26.0–34.0)
MCHC: 29.7 g/dL — ABNORMAL LOW (ref 32.0–36.0)
MCV: 89.1 fL (ref 80.0–100.0)
MONOS PCT: 2 %
Monocytes Absolute: 0.2 10*3/uL (ref 0.2–1.0)
NEUTROS PCT: 31 %
Neutro Abs: 2.5 10*3/uL (ref 1.4–6.5)
Platelets: 118 10*3/uL — ABNORMAL LOW (ref 150–440)
RBC: 5.25 MIL/uL (ref 4.40–5.90)
RDW: 18.4 % — ABNORMAL HIGH (ref 11.5–14.5)
WBC: 8 10*3/uL (ref 3.8–10.6)

## 2016-04-30 LAB — LIPID PANEL
Cholesterol: 123 mg/dL (ref 0–200)
HDL: 36 mg/dL — AB (ref >40)
LDL Cholesterol: 69 mg/dL (ref 0–99)
Total CHOL/HDL Ratio: 3.4 RATIO
Triglycerides: 89 mg/dL (ref <150)
VLDL: 18 mg/dL (ref 0–40)

## 2016-04-30 LAB — APTT: aPTT: 67 seconds — ABNORMAL HIGH (ref 24–36)

## 2016-04-30 LAB — PROTIME-INR
INR: 1.81
Prothrombin Time: 21.2 seconds — ABNORMAL HIGH (ref 11.4–15.2)

## 2016-04-30 LAB — TROPONIN I: TROPONIN I: 0.06 ng/mL — AB (ref <0.03)

## 2016-04-30 LAB — GLUCOSE, CAPILLARY: GLUCOSE-CAPILLARY: 117 mg/dL — AB (ref 65–99)

## 2016-04-30 MED ORDER — EPINEPHRINE HCL 0.1 MG/ML IJ SOSY
PREFILLED_SYRINGE | INTRAMUSCULAR | Status: DC | PRN
  Administered 2016-04-30 (×2): 1 mg via INTRAVENOUS

## 2016-04-30 MED ORDER — SODIUM BICARBONATE 8.4 % IV SOLN
INTRAVENOUS | Status: AC | PRN
  Administered 2016-04-30: 50 meq via INTRAVENOUS

## 2016-04-30 MED ORDER — ATROPINE SULFATE 1 MG/ML IJ SOLN
INTRAMUSCULAR | Status: DC | PRN
  Administered 2016-04-30 (×4): 1 mg via INTRAVENOUS

## 2016-04-30 MED ORDER — ATROPINE SULFATE 1 MG/ML IJ SOLN
INTRAMUSCULAR | Status: AC | PRN
  Administered 2016-04-30: 1 mg via INTRAVENOUS

## 2016-04-30 MED ORDER — ATROPINE SULFATE 1 MG/10ML IJ SOSY
PREFILLED_SYRINGE | INTRAMUSCULAR | Status: AC
  Filled 2016-04-30: qty 30

## 2016-04-30 MED ORDER — EPINEPHRINE HCL 0.1 MG/ML IJ SOSY
PREFILLED_SYRINGE | INTRAMUSCULAR | Status: AC | PRN
  Administered 2016-04-30: 1 mg via INTRAVENOUS

## 2016-04-30 MED ORDER — SODIUM CHLORIDE 0.9 % IV SOLN: INTRAVENOUS | Status: DC | PRN

## 2016-04-30 MED ORDER — DEXTROSE 5 % IV SOLN
0.5000 ug/min | INTRAVENOUS | Status: DC
  Administered 2016-04-30: 0.5 ug/min via INTRAVENOUS
  Filled 2016-04-30: qty 4

## 2016-04-30 MED ORDER — SODIUM CHLORIDE 0.9 % IV SOLN: 10.0000 mL/h | INTRAVENOUS | Status: DC

## 2016-04-30 MED ORDER — CALCIUM CHLORIDE 10 % IV SOLN
INTRAVENOUS | Status: AC | PRN
  Administered 2016-04-30: 1 g via INTRAVENOUS

## 2016-04-30 MED ORDER — CALCIUM CHLORIDE 10 % IV SOLN
INTRAVENOUS | Status: AC | PRN
  Administered 2016-04-30 (×2): 1 g via INTRAVENOUS

## 2016-04-30 MED ORDER — EPINEPHRINE 0.3 MG/0.3ML IJ SOAJ: 0.3000 mg | Freq: Once | INTRAMUSCULAR | Status: DC

## 2016-04-30 MED ORDER — EPINEPHRINE HCL 0.1 MG/ML IJ SOSY
PREFILLED_SYRINGE | INTRAMUSCULAR | Status: AC | PRN
  Administered 2016-04-30 (×4): 1 mg via INTRAVENOUS

## 2016-04-30 MED ORDER — ATROPINE SULFATE 1 MG/ML IJ SOLN
INTRAMUSCULAR | Status: AC | PRN
  Administered 2016-04-30 (×3): 1 mg via INTRAVENOUS

## 2016-04-30 NOTE — Code Documentation (Addendum)
Pulse at this time, HR 63

## 2016-04-30 NOTE — ED Notes (Signed)
Lab called critical K 6.3, troponin 0.06 MD Archie Balboa notified

## 2016-04-30 NOTE — ED Notes (Addendum)
Family at bedside with pt. Family ask for MD to come to bedside.

## 2016-04-30 NOTE — Code Documentation (Addendum)
MD attempting to pace pt at this time, tried up to 140 at this time with no capture

## 2016-04-30 NOTE — Code Documentation (Addendum)
Rhythm noted at this time per MD Archie Balboa

## 2016-04-30 NOTE — ED Notes (Signed)
Pulses found HR 65

## 2016-04-30 NOTE — ED Notes (Signed)
Pulses found HR 41

## 2016-04-30 NOTE — Code Documentation (Signed)
Pulse check found

## 2016-04-30 NOTE — ED Provider Notes (Signed)
[facility] Emergency Department Provider Note    ____________________________________________   I have reviewed the triage vital signs and the nursing notes.   HISTORY  Chief Complaint Unresponsive. CPR  History limited by: Unresponsive   HPI Gary [name] is a 60 y.o. male who presents to the emergency department via EMS via emergency traffic. Per EMS the patient was with family when he collapsed. He was then unresponsive. When EMS arrived on scene firefighters were there and CPR was in progress. Initial rhythm was asystole. After multiple rounds of epinephrine EMS did get a pulse back. This lasted for about 5 minutes before they lost pulses and the patient went asystolic again. EMS did place a King airway during the resuscitation. Total time since EMS arrived on scene until arrival at the hospital was roughly 1 hour. Pulses were lost roughly 25 minutes prior to arrival.   Past Medical History:  Diagnosis Date  . Chronic systolic CHF (congestive heart failure) (HCC)    a. see echo   . Coronary artery disease, non-occlusive    a. cardiac cath 04/16/16: mLAD 30%, p-mRCA 20%, OM2 30% x 2, RPDA 60%, dilated cardiomyopathy w/ diffuse HK, nl right pressures  . Enlarged heart   . Frequent PVCs   . Hypertension   . NICM (nonischemic cardiomyopathy) ([place])    a. echo 04/14/16: EF 15%, mild concentric LVH, mild AI  . Prostate cancer ([place])   . Sleep apnea   . Stroke ([place])   . 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 Acute on chronic systolic (congestive) heart failure 04/18/2016  . Coronary artery disease, non-occlusive 04/18/2016  . AKI (acute kidney injury) ([place]) 04/18/2016  . NICM (nonischemic cardiomyopathy) ([place]) 04/18/2016  . Hypomagnesemia 04/18/2016  . 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 Accelerated hypertension 04/14/2016  . HLD  (hyperlipidemia) 04/14/2016    Past Surgical History:  Procedure Laterality Date  . CARDIAC CATHETERIZATION N/A 04/16/2016   Procedure: Right/Left Heart Cath and Coronary Angiography;  Surgeon: Isaias Cowman, MD;  Location: [facility];  Service: Cardiovascular;  Laterality: N/A;  . Prostate I-125 seed implant  10/12/14  . tumor removed from colon      Prior to Admission medications   Medication Sig Start Date End Date Taking? Authorizing Provider  amiodarone (PACERONE) 200 MG tablet Take 3 tablets (600 mg total) by mouth 2 (two) times daily. 04/19/16   Max Sane, MD  amLODipine (NORVASC) 10 MG tablet Take 10 mg by mouth daily.    Historical Provider, MD  aspirin 81 MG tablet Take 81 mg by mouth daily.    Historical Provider, MD  atorvastatin (LIPITOR) 20 MG tablet Take 20 mg by mouth daily.    Historical Provider, MD  carvedilol (COREG) 12.5 MG tablet Take 1 tablet (12.5 mg total) by mouth 2 (two) times daily with a meal. 04/19/16   Max Sane, MD  digoxin (LANOXIN) 0.125 MG tablet Take 0.125 mg by mouth every other day.    Historical Provider, MD  enalapril (VASOTEC) 10 MG tablet Take 10 mg by mouth 2 (two) times daily.    Historical Provider, MD  magnesium oxide (MAG-OX) 400 (241.3 Mg) MG tablet Take 1 tablet (400 mg total) by mouth 2 (two) times daily. 04/19/16   Max Sane, MD  NIFEdipine (PROCARDIA XL/ADALAT-CC) 60 MG 24 hr tablet Take 60 mg by mouth daily.  Historical Provider, MD  spironolactone (ALDACTONE) 25 MG tablet Take 25 mg by mouth daily.    Historical Provider, MD    Allergies Review of patient's allergies indicates no known allergies.  Family History  Problem Relation Age of Onset  . Kidney cancer Neg Hx   . Prostate cancer Neg Hx     Social History Social History  Substance Use Topics  . Smoking status: Former Smoker    Types: Cigarettes    Quit date: 04/28/2014  . Smokeless tobacco: Never Used  . Alcohol use 0.6 oz/week    1 Cans of beer per  week     Comment: occassionally    Review of Systems Unable to obtain given unresponsiveness ____________________________________________   PHYSICAL EXAM:  VITAL SIGNS: ED Triage Vitals  Enc Vitals Group     BP 05/13/2016 2149 (!) 152/114     Pulse Rate 05/19/2016 2148 61     Resp 05/11/2016 2148 17     Temp 05/09/2016 2145 98.4 F (36.9 C)     Temp Source 05/19/2016 2145 Rectal     SpO2 05/23/2016 2154 (!) 81 %   Constitutional: A King airway in place, [place] device in place and active. Eyes: Bilateral dilated and fixed pupils ENT   Head: Normocephalic and atraumatic.   Mouth/Throat: King airway in place Cardiovascular: Asystolic. [place] device in place. Respiratory: King airway in place. Patient actively being bagged Gastrointestinal: Soft. No distention.  Genitourinary: Deferred Musculoskeletal: No lower extremity edema. Neurologic:  Pupils fixed and dilated. Patient unresponsive to painful stimuli.  ____________________________________________    LABS (pertinent positives/negatives)  Labs Reviewed  COMPREHENSIVE METABOLIC PANEL - Abnormal; Notable for the following:       Result Value   Potassium 6.3 (*)    Chloride 114 (*)    CO2 10 (*)    Glucose, Bld 156 (*)    Creatinine, Ser 2.19 (*)    Calcium 11.0 (*)    Total Protein 4.9 (*)    Albumin 2.4 (*)    AST 242 (*)    ALT 213 (*)    GFR calc non Af Amer 31 (*)    GFR calc Af Amer 36 (*)    Anion gap 19 (*)    All other components within normal limits  TROPONIN I - Abnormal; Notable for the following:    Troponin I 0.06 (*)    All other components within normal limits  LIPID PANEL - Abnormal; Notable for the following:    HDL 36 (*)    All other components within normal limits  CBC WITH DIFFERENTIAL/PLATELET - Abnormal; Notable for the following:    MCHC 29.7 (*)    RDW 18.4 (*)    Platelets 118 (*)    Lymphs Abs 5.0 (*)    All other components within normal limits  GLUCOSE, CAPILLARY - Abnormal;  Notable for the following:    Glucose-Capillary 117 (*)    All other components within normal limits  PROTIME-INR  APTT  CBC WITH DIFFERENTIAL/PLATELET     ____________________________________________   EKG  I, Nance Pear, attending physician, personally viewed and interpreted this EKG  EKG Time: 2146 Rate: 89 Rhythm: ectopic atrial rhythm Axis: normal Intervals: qtc 505 QRS: wide, non specific IVCD ST changes: repolarization abnormality Impression: abnormal ekg  I, Nance Pear, attending physician, personally viewed and interpreted this EKG  EKG Time: 2219 Rate: 61 Rhythm: atrial fibrillation Axis: left axis deviation Intervals: qtc 577 QRS: wide, nonspecific IVCD ST changes: LVH  with repolarization abnormality Impression: abnormal ekg  I, Nance Pear, attending physician, personally viewed and interpreted this EKG  EKG Time: 2239 Rate: 39 Rhythm: sinus bradycardia with 1st degree av block Axis: normal Intervals: qtc 507 QRS: nonspecific IVCD ST changes: no st elevation Impression: abnormal ekg   ____________________________________________    RADIOLOGY  CXR My impression is diffuse edema. ET tube in place.  I, Archie Balboa, Tion Tse, personally viewed and evaluated these images (plain radiographs) as part of my medical decision making. ____________________________________________   PROCEDURES  Procedures  Cardiopulmonary Resuscitation (CPR) Procedure Note Directed/Performed by: Nance Pear I personally directed ancillary staff and/or performed CPR in an effort to regain return of spontaneous circulation and to maintain cardiac, neuro and systemic perfusion.   INTUBATION Performed by: Nance Pear  Required items: required blood products, implants, devices, and special equipment available  Indications: Post cardiac arrest  Intubation method: Glidescope   Preoxygenation: BVM   Tube Size: 7.5 cuffed  Post-procedure  assessment: chest rise and ETCO2 monitor Breath sounds: equal and absent over the epigastrium Tube secured with: ETT holder Chest x-ray interpreted by me.  Chest x-ray findings: endotracheal tube in appropriate position  CRITICAL CARE Performed by: Nance Pear   Total critical care time: 90 minutes  Critical care time was exclusive of separately billable procedures and treating other patients.  Critical care was necessary to treat or prevent imminent or life-threatening deterioration.  Critical care was time spent personally by me on the following activities: development of treatment plan with patient and/or surrogate as well as nursing, discussions with consultants, evaluation of patient's response to treatment, examination of patient, obtaining history from patient or surrogate, ordering and performing treatments and interventions, ordering and review of laboratory studies, ordering and review of radiographic studies, pulse oximetry and re-evaluation of patient's condition.    ____________________________________________   INITIAL IMPRESSION / ASSESSMENT AND PLAN / ED COURSE  Pertinent labs & imaging results that were available during my care of the patient were reviewed by me and considered in my medical decision making (see chart for details).  Patient presented to the emergency department today via EMS under emergency traffic. Patient was actively undergoing cardiopulmonary resuscitation upon arrival to the emergency department. Initial exam showed a unresponsive male with dilated fixed pupils. The patient was taken off the [place] device and CPR was continued. Initial rhythm check showed asystole . The patient received multiple rounds of medication (please see nursing documentation for exact medication, timing and dosing). We were able to achieve return of spontaneous circulation. However it was quite bradycardic. The patient was then given atropine. Additionally we did attempt  to pace the patient however could never successfully gain capture. The patient had the [facility] airway replaced by an ET tube. The patient required further rounds of CPR and epinephrine. Eventually the patient was placed on an epinephrine drip. He was relatively stable on the epinephrine drip and on the ventilator however remained quite bradycardic and hypotensive. I had a long discussion with family about goals of care. I did frankly tell the family that I had little hope for any significant neurologic recovery given length of CPR and code. Family after much discussion amongst themselves did decide to withdraw care. The patient's epinephrine drip was turned off and he was extubated per the family's wishes. He expired shortly afterwards. Time of death was 63. ____________________________________________   FINAL CLINICAL IMPRESSION(S) / ED DIAGNOSES  Final diagnoses:  Cardiopulmonary arrest [facility])     Note: This dictation was prepared  with Sales executive. Any transcriptional errors that result from this process are unintentional    Nance Pear, MD 05-11-2016 1842

## 2016-04-30 NOTE — ED Notes (Addendum)
Pulses found HR 65

## 2016-04-30 NOTE — ED Notes (Signed)
Family at bedside. 

## 2016-04-30 NOTE — Code Documentation (Addendum)
Pt via EMS CPR in progress, found down outside of home witnessed cardiac arrest. Unknown if CPR was started initally. EMS CPR in field for aprox 1hour.  EMS had pt rhythm after 15min of CPR and then lost pulses again en route. Pt has been given 10 epi, bicarb, and narcan in field per EMS. CPR in progress at this time. MD and RT at bedside.

## 2016-04-30 NOTE — Code Documentation (Signed)
No pulses at this time.  

## 2016-05-01 MED FILL — Medication: Qty: 2 | Status: AC

## 2016-05-08 ENCOUNTER — Ambulatory Visit: Payer: Medicare Other | Admitting: Urology

## 2016-05-09 ENCOUNTER — Encounter: Payer: Medicare Other | Admitting: Internal Medicine

## 2016-05-09 ENCOUNTER — Ambulatory Visit: Payer: Medicare Other | Admitting: Family

## 2016-05-29 DIAGNOSIS — 419620001 Death: Secondary | SNOMED CT | POA: Insufficient documentation

## 2016-05-29 NOTE — ED Notes (Signed)
MD, Respiratory, RN, chaplain and family at bedside.

## 2016-05-29 NOTE — ED Provider Notes (Signed)
Chaplain was called to be with family as they determined end of life directive. Multiple prayers were said as family gathered and again as patient was extubated and after final breath. Funeral home paperwork was completed at nursing station.

## 2016-05-29 NOTE — ED Notes (Signed)
COPA contacted, spoke with Erline Levine. Reference Number# [id number]

## 2016-05-29 NOTE — Progress Notes (Signed)
Dr. Archie Balboa at beside. Patient extubated

## 2016-05-29 NOTE — ED Notes (Signed)
Contact information for family obtained and placed on chart.

## 2016-05-29 DEATH — deceased

## 2016-06-04 ENCOUNTER — Encounter: Payer: Self-pay | Admitting: Internal Medicine

## 2018-04-30 IMAGING — DX DG CHEST 1V PORT
2 series · 2 of 2 positions shown · non-contrast
Comparison: 04/14/2016 and earlier.

CLINICAL DATA: 59-year-old male with pulmonary edema suspected on
chest radiographs performed for altered mental status and
tachycardia. Initial encounter.

EXAM:
PORTABLE CHEST 1 VIEW

[chest ap (1 of 2)]
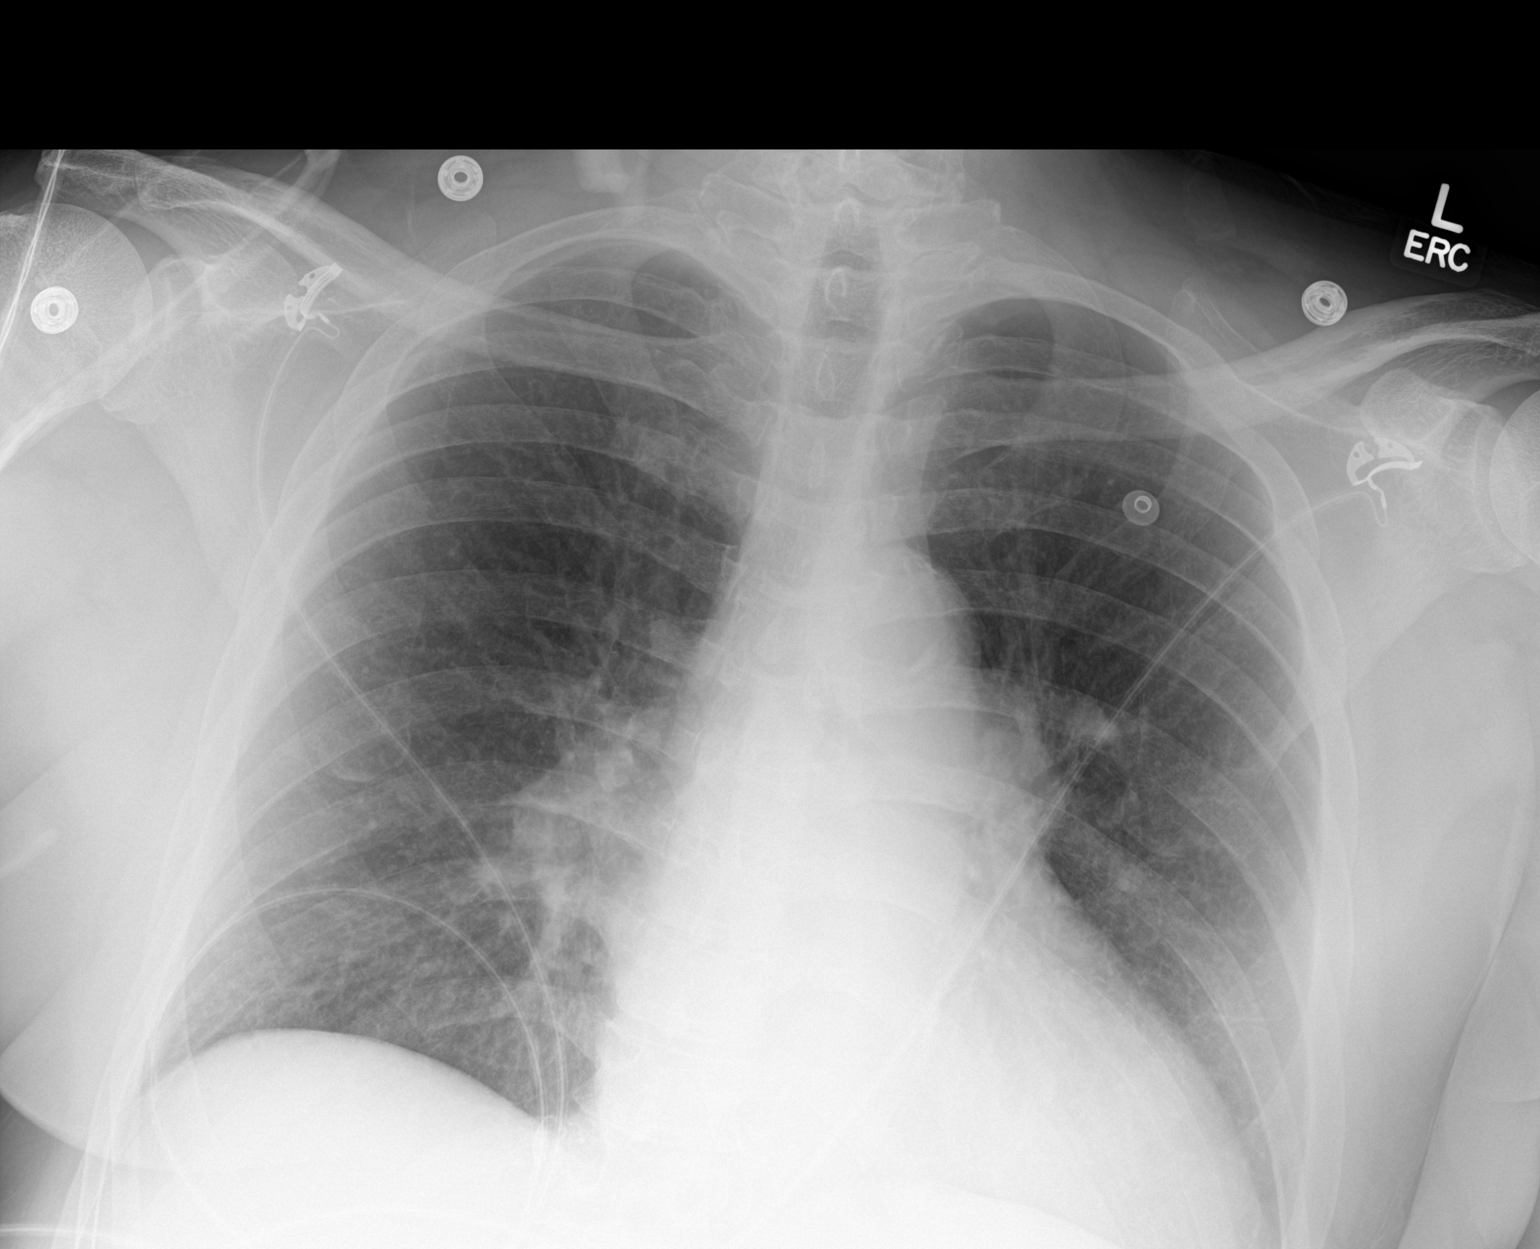

[chest ap (2 of 2)]
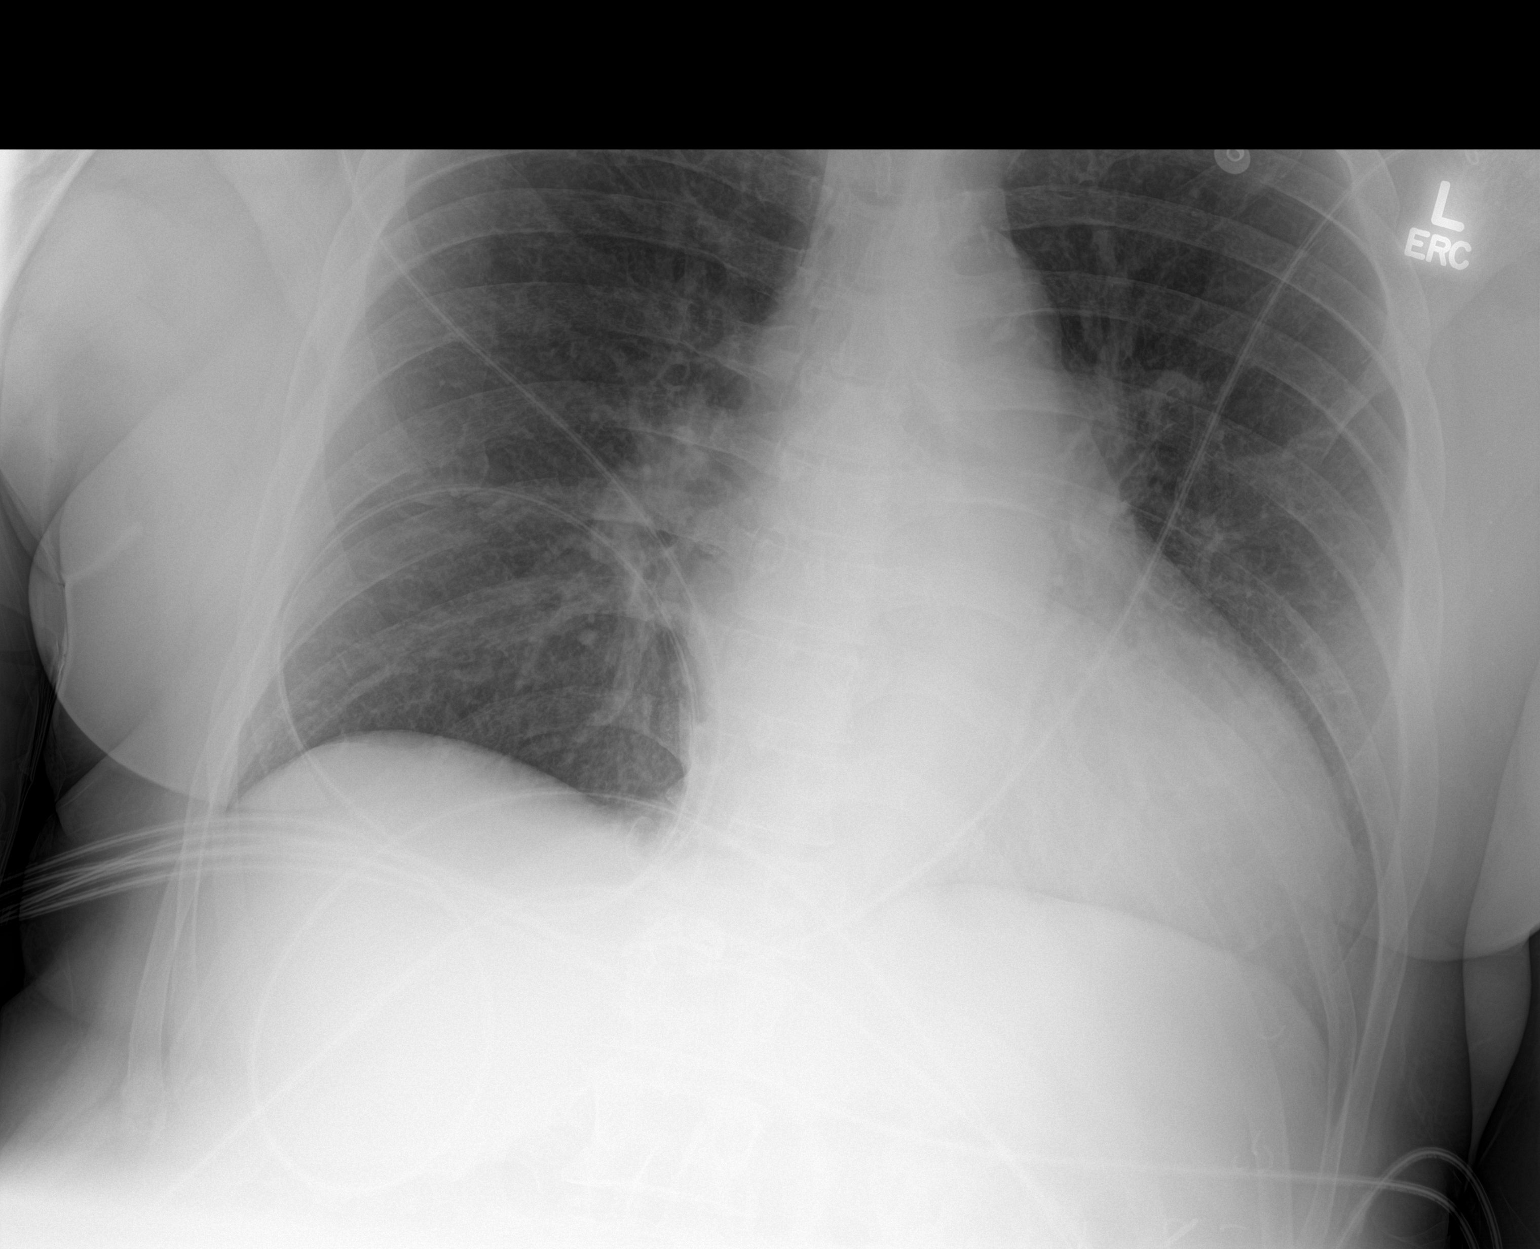

[2 of 2 positions shown; findings below may reference images not displayed]

FINDINGS: Portable AP upright view at 7276 hours. Stable cardiomegaly and
mediastinal contours. Regressed pulmonary vascularity. Now allowing
for portable technique the lungs are clear. No pneumothorax. No
pleural effusion identified. Calcified aortic atherosclerosis.
IMPRESSION: Resolved vascular congestion/interstitial edema. No acute
cardiopulmonary abnormality.

Cardiomegaly and Calcified aortic atherosclerosis.

## 2018-05-15 IMAGING — DX DG CHEST 1V PORT
1 series · 1 of 1 positions shown · non-contrast
Comparison: Chest radiograph dated 04/15/2016

CLINICAL DATA: 59-year-old male status post intubation.

EXAM:
PORTABLE CHEST 1 VIEW

[chest ap]
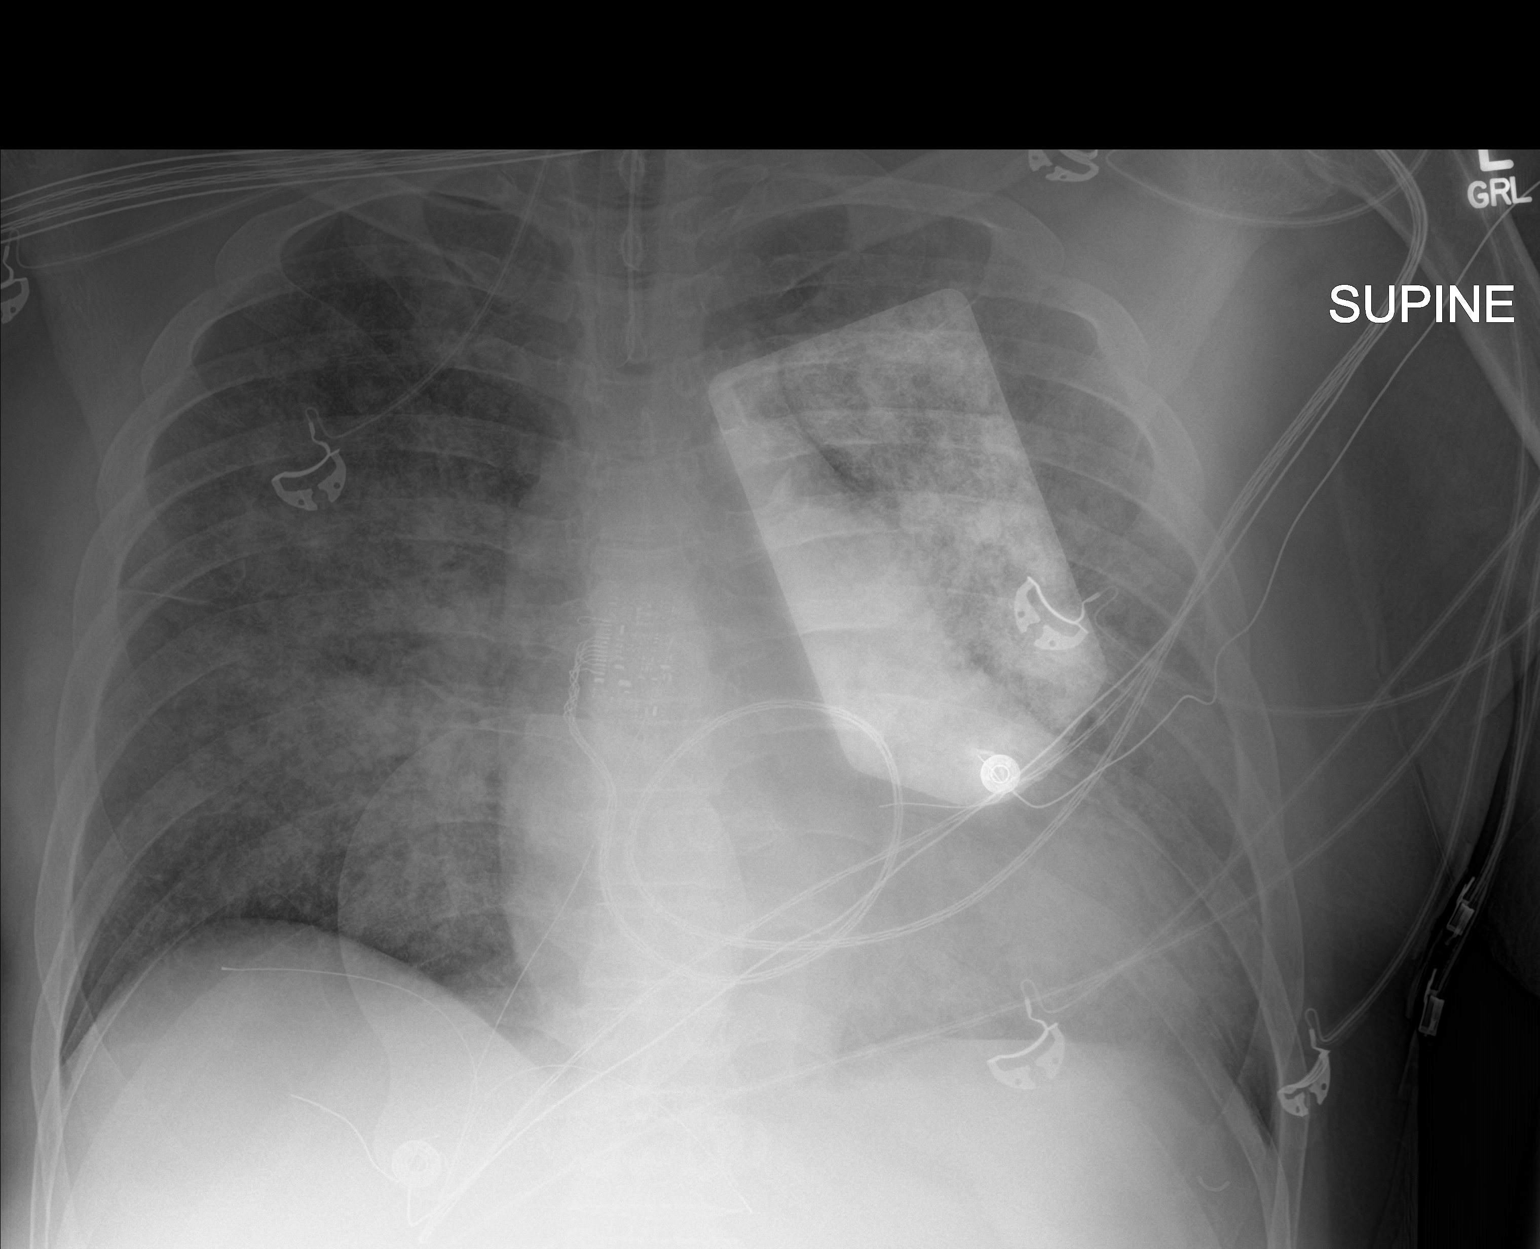

[1 of 1 positions shown; findings below may reference images not displayed]

FINDINGS: An endotracheal tube is noted with tip approximately 5.5 cm above
the carina.

There is diffuse nodular and hazy airspace densities throughout the
lungs. Findings may represent pulmonary edema, ARDS, or pneumonia.
Clinical correlation and follow-up recommended. There is no pleural
effusion or pneumothorax. There is stable cardiomegaly. No acute
osseous pathology.
IMPRESSION: Endotracheal tube above the carina.

Diffuse pulmonary airspace opacities as described. Follow-up
recommended.
